# Patient Record
Sex: Female | Born: 1965 | Race: Black or African American | Hispanic: No | Marital: Single | Smoking: Former smoker
Health system: Southern US, Community
[De-identification: ages and names within clinical notes are randomized; demographics above are authoritative.]

## PROBLEM LIST (undated history)

## (undated) DIAGNOSIS — D352 Benign neoplasm of pituitary gland: Secondary | ICD-10-CM

## (undated) DIAGNOSIS — E785 Hyperlipidemia, unspecified: Secondary | ICD-10-CM

## (undated) DIAGNOSIS — R7303 Prediabetes: Secondary | ICD-10-CM

## (undated) DIAGNOSIS — O24419 Gestational diabetes mellitus in pregnancy, unspecified control: Secondary | ICD-10-CM

## (undated) DIAGNOSIS — E2740 Unspecified adrenocortical insufficiency: Secondary | ICD-10-CM

## (undated) HISTORY — DX: Prediabetes: R73.03

## (undated) HISTORY — PX: NO PAST SURGERIES: SHX2092

## (undated) HISTORY — PX: TRANSPHENOIDAL / TRANSNASAL HYPOPHYSECTOMY / RESECTION PITUITARY TUMOR: SUR1382

## (undated) HISTORY — DX: Hyperlipidemia, unspecified: E78.5

---

## 1999-09-21 ENCOUNTER — Encounter (INDEPENDENT_AMBULATORY_CARE_PROVIDER_SITE_OTHER): Payer: Self-pay | Admitting: Specialist

## 1999-09-21 ENCOUNTER — Other Ambulatory Visit: Admission: RE | Admit: 1999-09-21 | Discharge: 1999-09-21 | Payer: Self-pay | Admitting: Gastroenterology

## 2001-09-18 ENCOUNTER — Other Ambulatory Visit: Admission: RE | Admit: 2001-09-18 | Discharge: 2001-09-18 | Payer: Self-pay | Admitting: Obstetrics and Gynecology

## 2001-09-22 ENCOUNTER — Encounter: Payer: Self-pay | Admitting: Obstetrics and Gynecology

## 2001-09-22 ENCOUNTER — Encounter: Admission: RE | Admit: 2001-09-22 | Discharge: 2001-09-22 | Payer: Self-pay | Admitting: Obstetrics and Gynecology

## 2002-09-20 ENCOUNTER — Other Ambulatory Visit: Admission: RE | Admit: 2002-09-20 | Discharge: 2002-09-20 | Payer: Self-pay | Admitting: Internal Medicine

## 2003-03-19 ENCOUNTER — Emergency Department (HOSPITAL_COMMUNITY): Admission: EM | Admit: 2003-03-19 | Discharge: 2003-03-19 | Payer: Self-pay | Admitting: Emergency Medicine

## 2003-05-11 ENCOUNTER — Other Ambulatory Visit: Admission: RE | Admit: 2003-05-11 | Discharge: 2003-05-11 | Payer: Self-pay | Admitting: Obstetrics and Gynecology

## 2003-06-20 ENCOUNTER — Emergency Department (HOSPITAL_COMMUNITY): Admission: EM | Admit: 2003-06-20 | Discharge: 2003-06-20 | Payer: Self-pay | Admitting: Emergency Medicine

## 2004-06-28 ENCOUNTER — Other Ambulatory Visit: Admission: RE | Admit: 2004-06-28 | Discharge: 2004-06-28 | Payer: Self-pay | Admitting: Obstetrics and Gynecology

## 2005-06-05 ENCOUNTER — Other Ambulatory Visit: Admission: RE | Admit: 2005-06-05 | Discharge: 2005-06-05 | Payer: Self-pay | Admitting: Obstetrics and Gynecology

## 2008-03-21 ENCOUNTER — Other Ambulatory Visit: Admission: RE | Admit: 2008-03-21 | Discharge: 2008-03-21 | Payer: Self-pay | Admitting: Family Medicine

## 2012-08-14 ENCOUNTER — Emergency Department (HOSPITAL_BASED_OUTPATIENT_CLINIC_OR_DEPARTMENT_OTHER)
Admission: EM | Admit: 2012-08-14 | Discharge: 2012-08-14 | Disposition: A | Discharge status: Home / Self Care | Payer: Self-pay | Attending: Emergency Medicine | Admitting: Emergency Medicine

## 2012-08-14 ENCOUNTER — Encounter (HOSPITAL_BASED_OUTPATIENT_CLINIC_OR_DEPARTMENT_OTHER): Payer: Self-pay | Admitting: Family Medicine

## 2012-08-14 DIAGNOSIS — B379 Candidiasis, unspecified: Secondary | ICD-10-CM | POA: Insufficient documentation

## 2012-08-14 DIAGNOSIS — N390 Urinary tract infection, site not specified: Secondary | ICD-10-CM | POA: Insufficient documentation

## 2012-08-14 DIAGNOSIS — N760 Acute vaginitis: Secondary | ICD-10-CM | POA: Insufficient documentation

## 2012-08-14 DIAGNOSIS — B373 Candidiasis of vulva and vagina: Secondary | ICD-10-CM

## 2012-08-14 DIAGNOSIS — B9689 Other specified bacterial agents as the cause of diseases classified elsewhere: Secondary | ICD-10-CM

## 2012-08-14 DIAGNOSIS — R109 Unspecified abdominal pain: Secondary | ICD-10-CM | POA: Insufficient documentation

## 2012-08-14 DIAGNOSIS — R300 Dysuria: Secondary | ICD-10-CM | POA: Insufficient documentation

## 2012-08-14 DIAGNOSIS — R350 Frequency of micturition: Secondary | ICD-10-CM | POA: Insufficient documentation

## 2012-08-14 DIAGNOSIS — Z3202 Encounter for pregnancy test, result negative: Secondary | ICD-10-CM | POA: Insufficient documentation

## 2012-08-14 DIAGNOSIS — B3731 Acute candidiasis of vulva and vagina: Secondary | ICD-10-CM

## 2012-08-14 DIAGNOSIS — Z880 Allergy status to penicillin: Secondary | ICD-10-CM | POA: Insufficient documentation

## 2012-08-14 LAB — URINALYSIS, ROUTINE W REFLEX MICROSCOPIC
Bilirubin Urine: NEGATIVE
Glucose, UA: NEGATIVE mg/dL
Hgb urine dipstick: NEGATIVE
Ketones, ur: NEGATIVE mg/dL
Nitrite: NEGATIVE
Protein, ur: NEGATIVE mg/dL
Specific Gravity, Urine: 1.022 (ref 1.005–1.030)
Urobilinogen, UA: 0.2 mg/dL (ref 0.0–1.0)
pH: 7 (ref 5.0–8.0)

## 2012-08-14 LAB — URINE MICROSCOPIC-ADD ON

## 2012-08-14 LAB — WET PREP, GENITAL
Trich, Wet Prep: NONE SEEN
Yeast Wet Prep HPF POC: NONE SEEN

## 2012-08-14 LAB — PREGNANCY, URINE: Preg Test, Ur: NEGATIVE

## 2012-08-14 MED ORDER — CEPHALEXIN 500 MG PO CAPS: 500.0000 mg | ORAL_CAPSULE | Freq: Four times a day (QID) | ORAL | Status: DC

## 2012-08-14 MED ORDER — FLUCONAZOLE 50 MG PO TABS
150.0000 mg | ORAL_TABLET | Freq: Once | ORAL | Status: AC
  Administered 2012-08-14: 150 mg via ORAL
  Filled 2012-08-14: qty 1

## 2012-08-14 MED ORDER — METRONIDAZOLE 500 MG PO TABS: 500.0000 mg | ORAL_TABLET | Freq: Two times a day (BID) | ORAL | Status: DC

## 2012-08-14 NOTE — ED Provider Notes (Addendum)
History    CSN: [id number] Arrival date & time 08/14/12  58  First MD Initiated Contact with Patient 08/14/12 1104     Chief Complaint  Patient presents with  . Vaginal Discharge  . Dysuria   (Consider location/radiation/quality/duration/timing/severity/associated sxs/prior Treatment) Patient is a 47 y.o. female presenting with vaginal discharge and dysuria. The history is provided by the patient.  Vaginal Discharge Associated symptoms: abdominal pain and dysuria   Associated symptoms: no fever, no nausea and no vomiting   Dysuria Associated symptoms: abdominal pain and vaginal discharge   Associated symptoms: no fever, no nausea and no vomiting    Two day history of whitish vaginal discharge dysuria and urinary frequency. Associated with mild lower quadrant abdominal pain 2/10. No back pain no nausea vomiting or diarrhea no fever. Patient is not concerned about an STD. Pain is achy in nature. Not made worse or better by anything.    History reviewed. No pertinent past medical history. History reviewed. No pertinent past surgical history. No family history on file. History  Substance Use Topics  . Smoking status: Never Smoker   . Smokeless tobacco: Not on file  . Alcohol Use: Yes   OB History   Grav Para Term Preterm Abortions TAB SAB Ect Mult Living                 Review of Systems  Constitutional: Negative for fever.  HENT: Negative for sore throat.   Respiratory: Negative for shortness of breath.   Cardiovascular: Negative for chest pain.  Gastrointestinal: Positive for abdominal pain. Negative for nausea, vomiting and diarrhea.  Endocrine: Negative for polydipsia and polyuria.  Genitourinary: Positive for dysuria, frequency and vaginal discharge.  Musculoskeletal: Negative for myalgias and back pain.  Skin: Negative for rash.  Neurological: Negative for headaches.  Hematological: Does not bruise/bleed easily.  Psychiatric/Behavioral: Negative for confusion.     Allergies  Amoxicillin  Home Medications  No current outpatient prescriptions on file. BP 109/68  Pulse 74  Temp(Src) 98.3 F (36.8 C) (Oral)  Resp 20  SpO2 100%  LMP 07/02/2012 Physical Exam  Nursing note and vitals reviewed. Constitutional: She is oriented to person, place, and time. She appears well-developed and well-nourished. No distress.  HENT:  Head: Normocephalic and atraumatic.  Mouth/Throat: Oropharynx is clear and moist.  Eyes: Conjunctivae and EOM are normal. Pupils are equal, round, and reactive to light.  Cardiovascular: Normal rate, regular rhythm and normal heart sounds.   No murmur heard. Pulmonary/Chest: Effort normal and breath sounds normal.  Abdominal: Soft. Bowel sounds are normal. She exhibits no mass. There is no tenderness. There is no rebound.  Genitourinary: Vaginal discharge found.  External genitalia is normal. No lesions. Whitish thick vaginal discharge. Vaginal vault normal no cervical motion tenderness no uterine tenderness no adnexal tenderness.  Musculoskeletal: Normal range of motion. She exhibits no edema.  Neurological: She is alert and oriented to person, place, and time. No cranial nerve deficit. She exhibits normal muscle tone. Coordination normal.  Skin: Skin is warm. No rash noted.    ED Course  Procedures (including critical care time) Labs Reviewed  URINALYSIS, ROUTINE W REFLEX MICROSCOPIC - Abnormal; Notable for the following:    APPearance CLOUDY (*)    Leukocytes, UA SMALL (*)    All other components within normal limits  URINE MICROSCOPIC-ADD ON - Abnormal; Notable for the following:    Squamous Epithelial / LPF MANY (*)    Bacteria, UA MANY (*)    All  other components within normal limits  URINE CULTURE  GC/CHLAMYDIA PROBE AMP  WET PREP, GENITAL  PREGNANCY, URINE   Results for orders placed during the hospital encounter of 08/14/12  URINALYSIS, ROUTINE W REFLEX MICROSCOPIC      Result Value Range   Color, Urine  YELLOW  YELLOW   APPearance CLOUDY (*) CLEAR   Specific Gravity, Urine 1.022  1.005 - 1.030   pH 7.0  5.0 - 8.0   Glucose, UA NEGATIVE  NEGATIVE mg/dL   Hgb urine dipstick NEGATIVE  NEGATIVE   Bilirubin Urine NEGATIVE  NEGATIVE   Ketones, ur NEGATIVE  NEGATIVE mg/dL   Protein, ur NEGATIVE  NEGATIVE mg/dL   Urobilinogen, UA 0.2  0.0 - 1.0 mg/dL   Nitrite NEGATIVE  NEGATIVE   Leukocytes, UA SMALL (*) NEGATIVE  PREGNANCY, URINE      Result Value Range   Preg Test, Ur NEGATIVE  NEGATIVE  URINE MICROSCOPIC-ADD ON      Result Value Range   Squamous Epithelial / LPF MANY (*) RARE   WBC, UA 3-6  <3 WBC/hpf   RBC / HPF 0-2  <3 RBC/hpf   Bacteria, UA MANY (*) RARE    Results for orders placed during the hospital encounter of 08/14/12  WET PREP, GENITAL      Result Value Range   Yeast Wet Prep HPF POC NONE SEEN  NONE SEEN   Trich, Wet Prep NONE SEEN  NONE SEEN   Clue Cells Wet Prep HPF POC MANY (*) NONE SEEN   WBC, Wet Prep HPF POC MANY (*) NONE SEEN  URINALYSIS, ROUTINE W REFLEX MICROSCOPIC      Result Value Range   Color, Urine YELLOW  YELLOW   APPearance CLOUDY (*) CLEAR   Specific Gravity, Urine 1.022  1.005 - 1.030   pH 7.0  5.0 - 8.0   Glucose, UA NEGATIVE  NEGATIVE mg/dL   Hgb urine dipstick NEGATIVE  NEGATIVE   Bilirubin Urine NEGATIVE  NEGATIVE   Ketones, ur NEGATIVE  NEGATIVE mg/dL   Protein, ur NEGATIVE  NEGATIVE mg/dL   Urobilinogen, UA 0.2  0.0 - 1.0 mg/dL   Nitrite NEGATIVE  NEGATIVE   Leukocytes, UA SMALL (*) NEGATIVE  PREGNANCY, URINE      Result Value Range   Preg Test, Ur NEGATIVE  NEGATIVE  URINE MICROSCOPIC-ADD ON      Result Value Range   Squamous Epithelial / LPF MANY (*) RARE   WBC, UA 3-6  <3 WBC/hpf   RBC / HPF 0-2  <3 RBC/hpf   Bacteria, UA MANY (*) RARE       No results found.   1. UTI (lower urinary tract infection)     MDM  Patient with complaint of vaginal discharge and dysuria. The discharged present for 2 days. Patient has had also  some burning with urination and frequency. Mild discomfort in the lower cord is of the abdomen nothing severe. Patient's not concerned about an STD. No nausea no vomiting no diarrhea. Patient has penicillin listed as an allergy but it makes her nauseated this is most likely a side effect. We'll treat the UTI with Keflex. Wet prep from the vaginal exam is still pending. On vaginal exam no cervical motion tenderness no uterine tenderness no adnexal tenderness. There is a vaginal discharge that clinically is consistent with yeast. Patient will be treated with Diflucan for that.  Prep was seems to be consistent with bacterial vaginosis. Clinically still looks very much like these teeth  in the wet prep negative for that we'll treat with Diflucan here will give some day course of Flagyl and we'll treat the urinary tract infection with Keflex. No true allergy to penicillin sounds like.  Mervin Kung, MD 08/14/12 [place], MD 08/14/12 1247

## 2012-08-14 NOTE — ED Notes (Signed)
Pt c/o dysuria and vaginal discharge x 2 days. Pt sts she thinks she might have a "bacteria".

## 2012-08-15 LAB — GC/CHLAMYDIA PROBE AMP
CT Probe RNA: NEGATIVE
GC Probe RNA: NEGATIVE

## 2012-08-15 LAB — URINE CULTURE: Colony Count: 65000

## 2012-09-03 ENCOUNTER — Telehealth (HOSPITAL_COMMUNITY): Payer: Self-pay | Admitting: Emergency Medicine

## 2014-08-31 ENCOUNTER — Emergency Department (HOSPITAL_COMMUNITY)
Admission: EM | Admit: 2014-08-31 | Discharge: 2014-08-31 | Disposition: A | Discharge status: Home / Self Care | Payer: Self-pay | Attending: Emergency Medicine | Admitting: Emergency Medicine

## 2014-08-31 ENCOUNTER — Emergency Department (HOSPITAL_COMMUNITY): Payer: Self-pay

## 2014-08-31 ENCOUNTER — Encounter (HOSPITAL_COMMUNITY): Payer: Self-pay

## 2014-08-31 DIAGNOSIS — Z880 Allergy status to penicillin: Secondary | ICD-10-CM | POA: Insufficient documentation

## 2014-08-31 DIAGNOSIS — Z3202 Encounter for pregnancy test, result negative: Secondary | ICD-10-CM | POA: Insufficient documentation

## 2014-08-31 DIAGNOSIS — H538 Other visual disturbances: Secondary | ICD-10-CM | POA: Insufficient documentation

## 2014-08-31 DIAGNOSIS — R350 Frequency of micturition: Secondary | ICD-10-CM | POA: Insufficient documentation

## 2014-08-31 LAB — URINALYSIS, ROUTINE W REFLEX MICROSCOPIC
Bilirubin Urine: NEGATIVE
Glucose, UA: NEGATIVE mg/dL
Hgb urine dipstick: NEGATIVE
Ketones, ur: NEGATIVE mg/dL
Leukocytes, UA: NEGATIVE
Nitrite: NEGATIVE
Protein, ur: NEGATIVE mg/dL
Specific Gravity, Urine: 1.013 (ref 1.005–1.030)
Urobilinogen, UA: 1 mg/dL (ref 0.0–1.0)
pH: 8 (ref 5.0–8.0)

## 2014-08-31 LAB — CBG MONITORING, ED
Glucose-Capillary: 105 mg/dL — ABNORMAL HIGH (ref 65–99)
Glucose-Capillary: 64 mg/dL — ABNORMAL LOW (ref 65–99)
Glucose-Capillary: 116 mg/dL — ABNORMAL HIGH (ref 65–99)

## 2014-08-31 LAB — COMPREHENSIVE METABOLIC PANEL
ALT: 23 U/L (ref 14–54)
AST: 27 U/L (ref 15–41)
Albumin: 4.5 g/dL (ref 3.5–5.0)
Alkaline Phosphatase: 57 U/L (ref 38–126)
Anion gap: 8 (ref 5–15)
BUN: 12 mg/dL (ref 6–20)
CO2: 28 mmol/L (ref 22–32)
Calcium: 9.6 mg/dL (ref 8.9–10.3)
Chloride: 104 mmol/L (ref 101–111)
Creatinine, Ser: 0.69 mg/dL (ref 0.44–1.00)
GFR calc Af Amer: >60 mL/min (ref >60)
GFR calc non Af Amer: >60 mL/min (ref >60)
Glucose, Bld: 77 mg/dL (ref 65–99)
Potassium: 3.9 mmol/L (ref 3.5–5.1)
Sodium: 140 mmol/L (ref 135–145)
Total Bilirubin: 0.4 mg/dL (ref 0.3–1.2)
Total Protein: 8.3 g/dL — ABNORMAL HIGH (ref 6.5–8.1)

## 2014-08-31 LAB — CBC WITH DIFFERENTIAL/PLATELET
Basophils Absolute: 0 10*3/uL (ref 0.0–0.1)
Basophils Relative: 0 % (ref 0–1)
Eosinophils Absolute: 0 10*3/uL (ref 0.0–0.7)
Eosinophils Relative: 0 % (ref 0–5)
HCT: 42.7 % (ref 36.0–46.0)
Hemoglobin: 13.8 g/dL (ref 12.0–15.0)
Lymphocytes Relative: 38 % (ref 12–46)
Lymphs Abs: 2.1 10*3/uL (ref 0.7–4.0)
MCH: 29.4 pg (ref 26.0–34.0)
MCHC: 32.3 g/dL (ref 30.0–36.0)
MCV: 90.9 fL (ref 78.0–100.0)
Monocytes Absolute: 0.4 10*3/uL (ref 0.1–1.0)
Monocytes Relative: 8 % (ref 3–12)
Neutro Abs: 2.9 10*3/uL (ref 1.7–7.7)
Neutrophils Relative %: 54 % (ref 43–77)
Platelets: 221 10*3/uL (ref 150–400)
RBC: 4.7 MIL/uL (ref 3.87–5.11)
RDW: 13.6 % (ref 11.5–15.5)
WBC: 5.4 10*3/uL (ref 4.0–10.5)

## 2014-08-31 LAB — POC URINE PREG, ED: Preg Test, Ur: NEGATIVE

## 2014-08-31 MED ORDER — FREESTYLE SYSTEM KIT: 1.0000 | PACK | Status: DC | PRN

## 2014-08-31 MED ORDER — GLUCOSE BLOOD VI STRP: ORAL_STRIP | Status: DC

## 2014-08-31 NOTE — ED Notes (Signed)
Pt went to MRI.

## 2014-08-31 NOTE — ED Notes (Signed)
Per MRI will be at bedside to transport pt in approx 45 minutes.

## 2014-08-31 NOTE — ED Notes (Signed)
Patient given: orange juice, graham crackers and peanut butter due to low sugar of 64.

## 2014-08-31 NOTE — ED Notes (Signed)
Flushed patient's IV.  In additional, asked patient to remove jewelry for MRI and she said she wasn't claustrophobic.

## 2014-08-31 NOTE — Discharge Instructions (Signed)
Your MRI shows that your pituitary infundibulum could be thickened. This could be normal vs a process called lymphocytic hypophysitis. It is very important that you call and make an appointment with the endocrinologist above. You should also get a primary care doctor for regular health maintenance.     Blurred Vision You have been seen today complaining of blurred vision. This means you have a loss of ability to see small details.  CAUSES  Blurred vision can be a symptom of underlying eye problems, such as:  Aging of the eye (presbyopia).  Glaucoma.  Cataracts.  Eye infection.  Eye-related migraine.  Diabetes mellitus.  Fatigue.  Migraine headaches.  High blood pressure.  Breakdown of the back of the eye (macular degeneration).  Problems caused by some medications. The most common cause of blurred vision is the need for eyeglasses or a new prescription. Today in the emergency department, no cause for your blurred vision can be found. SYMPTOMS  Blurred vision is the loss of visual sharpness and detail (acuity). DIAGNOSIS  Should blurred vision continue, you should see your caregiver. If your caregiver is your primary care physician, he or she may choose to refer you to another specialist.  TREATMENT  Do not ignore your blurred vision. Make sure to have it checked out to see if further treatment or referral is necessary. SEEK MEDICAL CARE IF:  You are unable to get into a specialist so we can help you with a referral. SEEK IMMEDIATE MEDICAL CARE IF: You have severe eye pain, severe headache, or sudden loss of vision. MAKE SURE YOU:   Understand these instructions.  Will watch your condition.  Will get help right away if you are not doing well or get worse. Document Released: 02/07/2003 Document Revised: 04/29/2011 Document Reviewed: 09/09/2007 [facility] Patient Information 2015 [place], [place]. This information is not intended to replace advice given to you by your  health care provider. Make sure you discuss any questions you have with your health care provider.   Hypoglycemia Hypoglycemia occurs when the glucose in your blood is too low. Glucose is a type of sugar that is your body's main energy source. Hormones, such as insulin and glucagon, control the level of glucose in the blood. Insulin lowers blood glucose and glucagon increases blood glucose. Having too much insulin in your blood stream, or not eating enough food containing sugar, can result in hypoglycemia. Hypoglycemia can happen to people with or without diabetes. It can develop quickly and can be a medical emergency.  CAUSES   Missing or delaying meals.  Not eating enough carbohydrates at meals.  Taking too much diabetes medicine.  Not timing your oral diabetes medicine or insulin doses with meals, snacks, and exercise.  Nausea and vomiting.  Certain medicines.  Severe illnesses, such as hepatitis, kidney disorders, and certain eating disorders.  Increased activity or exercise without eating something extra or adjusting medicines.  Drinking too much alcohol.  A nerve disorder that affects body functions like your heart rate, blood pressure, and digestion (autonomic neuropathy).  A condition where the stomach muscles do not function properly (gastroparesis). Therefore, medicines and food may not absorb properly.  Rarely, a tumor of the pancreas can produce too much insulin. SYMPTOMS   Hunger.  Sweating (diaphoresis).  Change in body temperature.  Shakiness.  Headache.  Anxiety.  Lightheadedness.  Irritability.  Difficulty concentrating.  Dry mouth.  Tingling or numbness in the hands or feet.  Restless sleep or sleep disturbances.  Altered speech and coordination.  Change in mental status.  Seizures or prolonged convulsions.  Combativeness.  Drowsiness (lethargic).  Weakness.  Increased heart rate or palpitations.  Confusion.  Pale, gray skin  color.  Blurred or double vision.  Fainting. DIAGNOSIS  A physical exam and medical history will be performed. Your caregiver may make a diagnosis based on your symptoms. Blood tests and other lab tests may be performed to confirm a diagnosis. Once the diagnosis is made, your caregiver will see if your signs and symptoms go away once your blood glucose is raised.  TREATMENT  Usually, you can easily treat your hypoglycemia when you notice symptoms.  Check your blood glucose. If it is less than 70 mg/dl, take one of the following:   3-4 glucose tablets.    cup juice.    cup regular soda.   1 cup skim milk.   -1 tube of glucose gel.   5-6 hard candies.   Avoid high-fat drinks or food that may delay a rise in blood glucose levels.  Do not take more than the recommended amount of sugary foods, drinks, gel, or tablets. Doing so will cause your blood glucose to go too high.   Wait 10-15 minutes and recheck your blood glucose. If it is still less than 70 mg/dl or below your target range, repeat treatment.   Eat a snack if it is more than 1 hour until your next meal.  There may be a time when your blood glucose may go so low that you are unable to treat yourself at home when you start to notice symptoms. You may need someone to help you. You may even faint or be unable to swallow. If you cannot treat yourself, someone will need to bring you to the hospital.  [place]  If you have diabetes, follow your diabetes management plan by:  Taking your medicines as directed.  Following your exercise plan.  Following your meal plan. Do not skip meals. Eat on time.  Testing your blood glucose regularly. Check your blood glucose before and after exercise. If you exercise longer or different than usual, be sure to check blood glucose more frequently.  Wearing your medical alert jewelry that says you have diabetes.  Identify the cause of your hypoglycemia. Then,  develop ways to prevent the recurrence of hypoglycemia.  Do not take a hot bath or shower right after an insulin shot.  Always carry treatment with you. Glucose tablets are the easiest to carry.  If you are going to drink alcohol, drink it only with meals.  Tell friends or family members ways to keep you safe during a seizure. This may include removing hard or sharp objects from the area or turning you on your side.  Maintain a healthy weight. SEEK MEDICAL CARE IF:   You are having problems keeping your blood glucose in your target range.  You are having frequent episodes of hypoglycemia.  You feel you might be having side effects from your medicines.  You are not sure why your blood glucose is dropping so low.  You notice a change in vision or a new problem with your vision. SEEK IMMEDIATE MEDICAL CARE IF:   Confusion develops.  A change in mental status occurs.  The inability to swallow develops.  Fainting occurs. Document Released: 02/04/2005 Document Revised: 02/09/2013 Document Reviewed: 06/03/2011 [facility] Patient Information 2015 [place], [place]. This information is not intended to replace advice given to you by your health care provider. Make sure you discuss any questions  you have with your health care provider.

## 2014-08-31 NOTE — ED Notes (Addendum)
Pt with blurred vision x 2 weeks.  Pt states she is a diabetic.  Has had on/off previously.  Pt does not take medications.  Pt has also increased urination with slight lower abdominal pain.  Increased stress.

## 2014-08-31 NOTE — ED Provider Notes (Signed)
CSN: [id number]     Arrival date & time 08/31/14  9357 History   First MD Initiated Contact with Patient 08/31/14 1108     Chief Complaint  Patient presents with  . Blurred Vision     (Consider location/radiation/quality/duration/timing/severity/associated sxs/prior Treatment) HPI  49 year old female presents with intermittent blurred vision for the past 2 weeks. She has also been having increased urination without dysuria during this time. Patient states that the blurry vision happens 3 or 4 times per week. Sometimes happens early in the morning, sometimes in the afternoon. Sometimes she has associated sweating and usually if she eats it ends up being better. This morning she had only had coffee when she started having the blurry vision. She ate some food but did not immediately feel better she came in the hospital. Currently she has no blurry vision. Whenever she does have blurry vision and feels like everything is a little blurred but not double. It is both eyes at the same time. Had a headache last night but usually has not been having a headache and does not currently have a headache. Occasionally has left great toe numbness but no other numbness and no weakness. Patient states she was told she was a diabetic couple years ago in [place] but has never taken medicines for it. She has been trying diet and exercise. She has not been checking her glucose when these symptoms occur. She has not felt like she will pass out. Over the past 1 week or so she has been trying [place] for her diabetes.   History reviewed. No pertinent past medical history. History reviewed. No pertinent past surgical history. History reviewed. No pertinent family history. History  Substance Use Topics  . Smoking status: Never Smoker   . Smokeless tobacco: Not on file  . Alcohol Use: Yes   OB History    No data available     Review of Systems  Constitutional: Negative for fever.  Eyes: Positive for visual  disturbance. Negative for pain.  Gastrointestinal: Negative for vomiting.  Genitourinary: Positive for frequency. Negative for dysuria.  Neurological: Negative for dizziness, weakness, light-headedness, numbness and headaches.  All other systems reviewed and are negative.     Allergies  Amoxicillin  Home Medications   Prior to Admission medications   Medication Sig Start Date End Date Taking? Authorizing Provider  OVER THE COUNTER MEDICATION Take 1 tablet by mouth daily.   Yes Historical Provider, MD   BP 132/69 mmHg  Pulse 74  Temp(Src) 97.7 F (36.5 C) (Oral)  Resp 16  SpO2 100% Physical Exam  Constitutional: She is oriented to person, place, and time. She appears well-developed and well-nourished.  HENT:  Head: Normocephalic and atraumatic.  Right Ear: External ear normal.  Left Ear: External ear normal.  Nose: Nose normal.  Mouth/Throat: Oropharynx is clear and moist.  Eyes: Conjunctivae and EOM are normal. Pupils are equal, round, and reactive to light. Right eye exhibits no discharge. Left eye exhibits no discharge.  Neck: Neck supple.  Cardiovascular: Normal rate, regular rhythm and normal heart sounds.   Pulmonary/Chest: Effort normal and breath sounds normal.  Abdominal: Soft. There is no tenderness.  Neurological: She is alert and oriented to person, place, and time.  CN 2-12 grossly intact. 5/5 strength in all 4 extremities. Grossly normal sensation  Skin: Skin is warm and dry.  Nursing note and vitals reviewed.   ED Course  Procedures (including critical care time) Labs Review Labs Reviewed  COMPREHENSIVE METABOLIC PANEL -  Abnormal; Notable for the following:    Total Protein 8.3 (*)    All other components within normal limits  CBG MONITORING, ED - Abnormal; Notable for the following:    Glucose-Capillary 105 (*)    All other components within normal limits  CBG MONITORING, ED - Abnormal; Notable for the following:    Glucose-Capillary 64 (*)    All  other components within normal limits  CBG MONITORING, ED - Abnormal; Notable for the following:    Glucose-Capillary 116 (*)    All other components within normal limits  URINALYSIS, ROUTINE W REFLEX MICROSCOPIC (NOT AT [facility])  CBC WITH DIFFERENTIAL/PLATELET  POC URINE PREG, ED    Imaging Review Mr Brain Wo Contrast  08/31/2014   CLINICAL DATA:  49 year old female with intermittent blurred vision for 2 weeks. Numbness. Initial encounter.  EXAM: MRI HEAD WITHOUT CONTRAST  TECHNIQUE: Multiplanar, multiecho pulse sequences of the brain and surrounding structures were obtained without intravenous contrast.  COMPARISON:  None.  FINDINGS: Normal cerebral volume. No restricted diffusion to suggest acute infarction. No midline shift, mass effect, evidence of mass lesion, ventriculomegaly, extra-axial collection or acute intracranial hemorrhage. Cervicomedullary junction within normal limits. Major intracranial vascular flow voids are within normal limits. Pearline Cables and white matter signal is within normal limits for age throughout the brain.  Pituitary gland size and configuration appears within normal limits, however, the pituitary infundibulum appears abnormally thickened (up to 6 mm diameter. See series 6, image 11 and series 10, image 17). No contrast administered. No definite suprasellar region mass effect. Optic chiasm appears to remain normal. No definite optic nerve abnormality, and other orbits soft tissues appear within normal limits.  Visible internal auditory structures appear normal. Visualized paranasal sinuses and mastoids are clear. Visualized scalp soft tissues are within normal limits. Normal bone marrow signal. Negative visualized cervical spine.  IMPRESSION: 1. 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 Recommend pituitary laboratory evaluation and followup Pituitary Protocol Brain MRI  without and with contrast to further characterize. 2. Otherwise normal for age noncontrast MRI appearance of the brain.   Electronically Signed   By: Genevie Ann M.D.   On: 08/31/2014 14:05     EKG Interpretation None      MDM   Final diagnoses:  Blurred vision    Patient's MRI has a possibly abnormal pituitary infundibulum but no compression that could cause blurry vision. I discussed the results and reviewed MRI with neuro on call, Dr. Janann Colonel, who does not believe this is causing her blurry vision but recommends outpatient endocrinology follow up. Her glucose went down to 64, which came up with oral intake. She later told me her symptoms recurred when her glucose was this low. Her labs currently are unremarkable, and technically she is not hypoglycemia. I will recommend establishing PCP f/u, following up with endocrinology, and getting a glucometer to take glucose readings when she feels these symptoms. Otherwise her neuro exam is normal, is stable for discharge.     Sherwood Gambler, MD 08/31/14 [phone]

## 2014-09-27 ENCOUNTER — Other Ambulatory Visit: Payer: Self-pay | Admitting: Internal Medicine

## 2014-09-28 ENCOUNTER — Other Ambulatory Visit: Payer: Self-pay | Admitting: Internal Medicine

## 2014-09-28 DIAGNOSIS — E237 Disorder of pituitary gland, unspecified: Secondary | ICD-10-CM

## 2014-10-01 ENCOUNTER — Ambulatory Visit
Admission: RE | Admit: 2014-10-01 | Discharge: 2014-10-01 | Disposition: A | Discharge status: Home / Self Care | Payer: 59 | Source: Ambulatory Visit | Attending: Internal Medicine | Admitting: Internal Medicine

## 2014-10-01 DIAGNOSIS — E237 Disorder of pituitary gland, unspecified: Secondary | ICD-10-CM

## 2014-10-01 MED ORDER — GADOBENATE DIMEGLUMINE 529 MG/ML IV SOLN
7.0000 mL | Freq: Once | INTRAVENOUS | Status: AC | PRN
  Administered 2014-10-01: 7 mL via INTRAVENOUS

## 2015-05-06 ENCOUNTER — Encounter (HOSPITAL_COMMUNITY): Payer: Self-pay | Admitting: *Deleted

## 2015-05-06 ENCOUNTER — Emergency Department (HOSPITAL_COMMUNITY)
Admission: EM | Admit: 2015-05-06 | Discharge: 2015-05-07 | Disposition: A | Discharge status: Home / Self Care | Payer: Self-pay | Attending: Emergency Medicine | Admitting: Emergency Medicine

## 2015-05-06 DIAGNOSIS — Z79899 Other long term (current) drug therapy: Secondary | ICD-10-CM | POA: Insufficient documentation

## 2015-05-06 DIAGNOSIS — R7309 Other abnormal glucose: Secondary | ICD-10-CM | POA: Insufficient documentation

## 2015-05-06 DIAGNOSIS — Z880 Allergy status to penicillin: Secondary | ICD-10-CM | POA: Insufficient documentation

## 2015-05-06 DIAGNOSIS — R7303 Prediabetes: Secondary | ICD-10-CM

## 2015-05-06 DIAGNOSIS — D849 Immunodeficiency, unspecified: Secondary | ICD-10-CM | POA: Insufficient documentation

## 2015-05-06 DIAGNOSIS — R631 Polydipsia: Secondary | ICD-10-CM | POA: Insufficient documentation

## 2015-05-06 DIAGNOSIS — H538 Other visual disturbances: Secondary | ICD-10-CM | POA: Insufficient documentation

## 2015-05-06 LAB — CBG MONITORING, ED: Glucose-Capillary: 80 mg/dL (ref 65–99)

## 2015-05-06 NOTE — ED Notes (Signed)
Pt states that her blood sugar had been going up and down for the last couple of weeks; pt states that it will be around 70 in the morning and then go up to around 150 after eating; pt states that she feels symptomatic around 150; pt states that she has had "blurry vision", increased thirst and increased urination at times; pt denies blurry vision at present; pt states that she has been diagnosed as pre-diabetic and had been controlling her blood sugar with diet and exercise; pt states that it is concerning her and she wants to get it evaluated

## 2015-05-07 NOTE — ED Provider Notes (Signed)
CSN: [id number]     Arrival date & time 05/06/15  2153 History   First MD Initiated Contact with Patient 05/07/15 0044     Chief Complaint  Patient presents with  . Diabetes     (Consider location/radiation/quality/duration/timing/severity/associated sxs/prior Treatment) HPI Comments: Rachel Bowen is a 50 y.o. female with a PMHx of pre-diabetes, who presents to the ED with complaints of blood sugars running from 70-154 and concerns regarding her prediabetes. She states that when she eats, her blood sugars will go into the 150s, and she gets slightly blurry vision with increased thirst when that occurs. This resolves after her blood sugar comes down into the normal range. She states that she is controlling her prediabetes with diet, but admits that she has not been exercising recently and started a new job which is caused more stress. She was concerned and wanted to get evaluated because earlier today her blood sugar was 154, although upon arrival it is 80. She currently has no complaints, denies any ongoing vision changes, polydipsia, recent fevers or chills, chest pain, shortness breath, abdominal pain, nausea vomiting, diarrhea, cons patient, dysuria, hematuria, numbness, tingling, weakness, or headaches. She has never taken anything for diabetes. PCP at [facility]. She currently feels full resolution of all her symptoms from earlier.   Patient is a 50 y.o. female presenting with diabetes problem. The history is provided by the patient and medical records. No language interpreter was used.  Diabetes This is a recurrent problem. The current episode started more than 1 year ago. The problem occurs intermittently. The problem has been waxing and waning. Pertinent negatives include no abdominal pain, arthralgias, chest pain, chills, fever, headaches, myalgias, nausea, numbness, urinary symptoms, vomiting or weakness. The symptoms are aggravated by eating. She has tried nothing for the  symptoms. The treatment provided no relief.    History reviewed. No pertinent past medical history. History reviewed. No pertinent past surgical history. No family history on file. Social History  Substance Use Topics  . Smoking status: Never Smoker   . Smokeless tobacco: None  . Alcohol Use: Yes   OB History    No data available     Review of Systems  Constitutional: Negative for fever and chills.  Eyes: Positive for visual disturbance (blurry vision with CBG 150s, none at this time).  Respiratory: Negative for shortness of breath.   Cardiovascular: Negative for chest pain.  Gastrointestinal: Negative for nausea, vomiting, abdominal pain and diarrhea.  Endocrine: Positive for polydipsia.  Genitourinary: Negative for dysuria and hematuria.  Musculoskeletal: Negative for myalgias and arthralgias.  Skin: Negative for color change.  Allergic/Immunologic: Positive for immunocompromised state (pre-diabetic).  Neurological: Negative for weakness, numbness and headaches.  Psychiatric/Behavioral: Negative for confusion.   10 Systems reviewed and are negative for acute change except as noted in the HPI.    Allergies  Amoxicillin  Home Medications   Prior to Admission medications   Medication Sig Start Date End Date Taking? Authorizing Provider  glucose blood (COOL BLOOD GLUCOSE TEST STRIPS) test strip Use as instructed 08/31/14   Sherwood Gambler, MD  glucose monitoring kit (FREESTYLE) monitoring kit 1 each by Does not apply route as needed for other. 08/31/14   Sherwood Gambler, MD  OVER THE COUNTER MEDICATION Take 1 tablet by mouth daily.    Historical Provider, MD   BP 111/84 mmHg  Pulse 67  Temp(Src) 97.7 F (36.5 C) (Oral)  Resp 16  SpO2 99%  LMP 07/02/2012 Physical Exam  Constitutional: She  is oriented to person, place, and time. Vital signs are normal. She appears well-developed and well-nourished.  Non-toxic appearance. No distress.  Afebrile, nontoxic, NAD  HENT:   Head: Normocephalic and atraumatic.  Mouth/Throat: Oropharynx is clear and moist and mucous membranes are normal.  Eyes: Conjunctivae and EOM are normal. Pupils are equal, round, and reactive to light. Right eye exhibits no discharge. Left eye exhibits no discharge.  PERRL, EOMI, no nystagmus, no visual field deficits   Neck: Normal range of motion. Neck supple.  Cardiovascular: Normal rate, regular rhythm, normal heart sounds and intact distal pulses.  Exam reveals no gallop and no friction rub.   No murmur heard. Pulmonary/Chest: Effort normal and breath sounds normal. No respiratory distress. She has no decreased breath sounds. She has no wheezes. She has no rhonchi. She has no rales.  Abdominal: Soft. Normal appearance and bowel sounds are normal. She exhibits no distension. There is no tenderness. There is no rigidity, no rebound, no guarding, no CVA tenderness, no tenderness at McBurney's point and negative Murphy's sign.  Musculoskeletal: Normal range of motion.  MAE x4 Strength and sensation grossly intact Distal pulses intact Gait steady  Neurological: She is alert and oriented to person, place, and time. She has normal strength. No cranial nerve deficit or sensory deficit. Coordination and gait normal. GCS eye subscore is 4. GCS verbal subscore is 5. GCS motor subscore is 6.  CN 2-12 grossly intact A&O x4 GCS 15 Sensation and strength intact Gait nonataxic Coordination with finger-to-nose WNL  Skin: Skin is warm, dry and intact. No rash noted.  Psychiatric: She has a normal mood and affect.  Nursing note and vitals reviewed.   ED Course  Procedures (including critical care time) Labs Review Labs Reviewed  CBG MONITORING, ED    Imaging Review No results found. I have personally reviewed and evaluated these images and lab results as part of my medical decision-making.   EKG Interpretation None      MDM   Final diagnoses:  Pre-diabetes  Blurry vision    50  y.o. female here with concern about her blood sugars fluctuating, range 70-150, when she gets to the 150 range she gets symptomatic with blurry vision, but this resolved. CBG here 80, no acute complaints. Admits more stress recently and less exercising, feels this may be contributing to her CBGs running higher. Discussed staying hydrated with water, exercising 3x/wk, and f/up with PCP. Doubt need for further labs/imaging. Neurologically intact, no focal findings on exam. Pt stable at this time. I explained the diagnosis and have given explicit precautions to return to the ER including for any other new or worsening symptoms. The patient understands and accepts the medical plan as it's been dictated and I have answered their questions. Discharge instructions concerning home care and prescriptions have been given. The patient is STABLE and is discharged to home in good condition.   BP 111/84 mmHg  Pulse 67  Temp(Src) 97.7 F (36.5 C) (Oral)  Resp 16  SpO2 99%  LMP 07/02/2012  No orders of the defined types were placed in this encounter.     Loistine Eberlin Camprubi-Soms, PA-C 05/07/15 0100  Veryl Speak, MD 05/08/15 [phone]

## 2015-05-07 NOTE — Discharge Instructions (Signed)
Continue the diet changes as you have been doing. Start exercising at least 30 minutes 3 times per week. When you feel symptoms of high blood sugar, drink plenty of water. Follow up with your regular doctor in 1 week for ongoing management of your symptoms. Return to the ER for changes or worsening symptoms.

## 2015-08-23 ENCOUNTER — Other Ambulatory Visit: Payer: Self-pay | Admitting: Physician Assistant

## 2015-08-23 ENCOUNTER — Other Ambulatory Visit (HOSPITAL_COMMUNITY)
Admission: RE | Admit: 2015-08-23 | Discharge: 2015-08-23 | Disposition: A | Discharge status: Home / Self Care | Payer: 59 | Source: Ambulatory Visit | Attending: Physician Assistant | Admitting: Physician Assistant

## 2015-08-23 DIAGNOSIS — Z01419 Encounter for gynecological examination (general) (routine) without abnormal findings: Secondary | ICD-10-CM | POA: Insufficient documentation

## 2015-08-23 DIAGNOSIS — Z1151 Encounter for screening for human papillomavirus (HPV): Secondary | ICD-10-CM | POA: Diagnosis not present

## 2015-08-24 LAB — CYTOLOGY - PAP

## 2017-03-08 ENCOUNTER — Emergency Department (HOSPITAL_COMMUNITY)
Admission: EM | Admit: 2017-03-08 | Discharge: 2017-03-08 | Disposition: A | Discharge status: Home / Self Care | Payer: 59 | Attending: Emergency Medicine | Admitting: Emergency Medicine

## 2017-03-08 ENCOUNTER — Other Ambulatory Visit: Payer: Self-pay

## 2017-03-08 ENCOUNTER — Encounter (HOSPITAL_COMMUNITY): Payer: Self-pay

## 2017-03-08 DIAGNOSIS — R42 Dizziness and giddiness: Secondary | ICD-10-CM | POA: Insufficient documentation

## 2017-03-08 DIAGNOSIS — E162 Hypoglycemia, unspecified: Secondary | ICD-10-CM

## 2017-03-08 DIAGNOSIS — R7303 Prediabetes: Secondary | ICD-10-CM

## 2017-03-08 DIAGNOSIS — Z87891 Personal history of nicotine dependence: Secondary | ICD-10-CM | POA: Insufficient documentation

## 2017-03-08 HISTORY — DX: Gestational diabetes mellitus in pregnancy, unspecified control: O24.419

## 2017-03-08 LAB — CBC
HCT: 44.6 % (ref 36.0–46.0)
Hemoglobin: 15 g/dL (ref 12.0–15.0)
MCH: 30.4 pg (ref 26.0–34.0)
MCHC: 33.6 g/dL (ref 30.0–36.0)
MCV: 90.5 fL (ref 78.0–100.0)
Platelets: 199 10*3/uL (ref 150–400)
RBC: 4.93 MIL/uL (ref 3.87–5.11)
RDW: 13.8 % (ref 11.5–15.5)
WBC: 5.1 10*3/uL (ref 4.0–10.5)

## 2017-03-08 LAB — BASIC METABOLIC PANEL
Anion gap: 7 (ref 5–15)
BUN: 13 mg/dL (ref 6–20)
CO2: 27 mmol/L (ref 22–32)
Calcium: 9.1 mg/dL (ref 8.9–10.3)
Chloride: 106 mmol/L (ref 101–111)
Creatinine, Ser: 0.64 mg/dL (ref 0.44–1.00)
GFR calc Af Amer: >60 mL/min (ref >60)
GFR calc non Af Amer: >60 mL/min (ref >60)
Glucose, Bld: 99 mg/dL (ref 65–99)
Potassium: 4 mmol/L (ref 3.5–5.1)
Sodium: 140 mmol/L (ref 135–145)

## 2017-03-08 LAB — I-STAT BETA HCG BLOOD, ED (MC, WL, AP ONLY): I-stat hCG, quantitative: <5 m[IU]/mL (ref <5)

## 2017-03-08 LAB — CBG MONITORING, ED
Glucose-Capillary: 84 mg/dL (ref 65–99)
Glucose-Capillary: 93 mg/dL (ref 65–99)

## 2017-03-08 NOTE — ED Triage Notes (Signed)
Pt reports fluctuating blood sugar x "a couple weeks."  Sts dizziness this morning and found her CBG to be 60.  Hx of prediabetes.  Sts she has been under a lot of stress.

## 2017-03-08 NOTE — ED Provider Notes (Signed)
[facility] DEPT Provider Note   CSN: [id number] Arrival date & time: 03/08/17  1223     History   Chief Complaint Chief Complaint  Patient presents with  . Medical Advice  . Blood Sugar Issue    HPI Rachel Bowen is a 52 y.o. female presenting for concerns about her blood sugar and evaluation of her lab work.  Patient states she was diagnosed with prediabetes when she was pregnant several years ago.  Since then, she has had fluctuation of her blood sugars.  This is been worse over the past several weeks.  She lost her job, no longer has insurance so she cannot follow-up with primary care.  She reports this morning, her blood sugar was low at 60 and she felt dizzy.  This improved after she and her blood sugar got better.  She came to the emergency room for further evaluation of her blood sugar and lab work.  Currently she is without any complaint.  She denies fevers, chills, nasal congestion, cough, sore throat, chest pain, difficulty breathing, nausea, vomiting, abdominal pain, urinary symptoms, abnormal bowel movements.  She has never taken any medicine for diabetes.  She states she has been trying to regulate her diabetes with diet.  For the past several weeks, has been fluctuating more.  She reports her lows are in the 60s and her highs are in the 160s.  She has no other medical problems, does not take medications daily.  HPI  Past Medical History:  Diagnosis Date  . Gestational diabetes     There are no active problems to display for this patient.   History reviewed. No pertinent surgical history.  OB History    No data available       Home Medications    Prior to Admission medications   Medication Sig Start Date End Date Taking? Authorizing Provider  OVER THE COUNTER MEDICATION Take 1 tablet by mouth daily.    [provider]    Family History History reviewed. No pertinent family history.  Social History Social History     Tobacco Use  . Smoking status: Former Research scientist (life sciences)  . Smokeless tobacco: Never Used  Substance Use Topics  . Alcohol use: No    Frequency: Never  . Drug use: No     Allergies   Amoxicillin   Review of Systems Review of Systems  Neurological: Positive for dizziness (resolved).  All other systems reviewed and are negative.    Physical Exam Updated Vital Signs BP 124/70 (BP Location: Right Arm)   Pulse 71   Temp 98.1 F (36.7 C) (Oral)   Resp 14   Ht 5' 2"  (1.575 m)   Wt 71.7 kg (158 lb)   LMP 07/02/2012   SpO2 99%   BMI 28.90 kg/m   Physical Exam  Constitutional: She is oriented to person, place, and time. She appears well-developed and well-nourished. No distress.  HENT:  Head: Normocephalic and atraumatic.  Eyes: Conjunctivae and EOM are normal. Pupils are equal, round, and reactive to light.  Neck: Normal range of motion.  Cardiovascular: Normal rate, regular rhythm and intact distal pulses.  Pulmonary/Chest: Effort normal and breath sounds normal. No respiratory distress. She has no wheezes.  Abdominal: Soft. She exhibits no distension and no mass. There is no tenderness. There is no guarding.  Musculoskeletal: Normal range of motion.  Neurological: She is alert and oriented to person, place, and time.  Skin: Skin is warm and dry.  Psychiatric: She has  a normal mood and affect.  Nursing note and vitals reviewed.    ED Treatments / Results  Labs (all labs ordered are listed, but only abnormal results are displayed) Labs Reviewed  BASIC METABOLIC PANEL  CBC  CBG MONITORING, ED  CBG MONITORING, ED  I-STAT BETA HCG BLOOD, ED ([facility], [facility], [facility] ONLY)    EKG  EKG Interpretation None       Radiology No results found.  Procedures Procedures (including critical care time)  Medications Ordered in ED Medications - No data to display   Initial Impression / Assessment and Plan / ED Course  I have reviewed the triage vital signs and the nursing  notes.  Pertinent labs & imaging results that were available during my care of the patient were reviewed by me and considered in my medical decision making (see chart for details).     Patient presenting for evaluation of her blood sugar and for lab work.  Physical exam reassuring, there is no abnormal findings.  Patient without any symptoms at this time.  Labs reassuring, glucose is stable.  Creatinine stable.  Discussed with patient that at this time, her blood sugar appears normal.  She will need to follow-up with primary care for further evaluation of her A1c.  Patient provided information for [facility] for follow-up.  Discussed diet and gave information about diet in diabetes.  At this time, patient appears safe for discharge.  Return precautions given.  Patient states she understands and agrees to plan.   Final Clinical Impressions(s) / ED Diagnoses   Final diagnoses:  Prediabetes  Low blood sugar    ED Discharge Orders    None       Franchot Heidelberg, PA-C 03/08/17 Berenice Primas, MD 03/08/17 [phone]

## 2017-03-08 NOTE — Discharge Instructions (Signed)
Continue to monitor your diet to help control your blood sugar - you are doing a great job! It is very important that you follow-up with [facility] for further evaluation and management of your blood sugars. Return to the emergency room if you develop chest pain, difficulty breathing, fevers, vomiting, or any new or concerning symptoms.

## 2017-03-17 ENCOUNTER — Inpatient Hospital Stay: Payer: Medicaid Other

## 2017-03-17 ENCOUNTER — Ambulatory Visit: Payer: Self-pay | Attending: Internal Medicine | Admitting: Physician Assistant

## 2017-03-17 VITALS — BP 115/76 | HR 75 | Temp 97.9°F | Ht 62.0 in | Wt 153.8 lb

## 2017-03-17 DIAGNOSIS — Z880 Allergy status to penicillin: Secondary | ICD-10-CM | POA: Insufficient documentation

## 2017-03-17 DIAGNOSIS — R7303 Prediabetes: Secondary | ICD-10-CM | POA: Insufficient documentation

## 2017-03-17 DIAGNOSIS — Z8632 Personal history of gestational diabetes: Secondary | ICD-10-CM | POA: Insufficient documentation

## 2017-03-17 DIAGNOSIS — E236 Other disorders of pituitary gland: Secondary | ICD-10-CM

## 2017-03-17 DIAGNOSIS — E237 Disorder of pituitary gland, unspecified: Secondary | ICD-10-CM | POA: Insufficient documentation

## 2017-03-17 NOTE — Patient Instructions (Signed)
Aim for 30 minutes of exercise most days. Rethink what you drink. Water is great! Aim for 2-3 Carb Choices per meal (30-45 grams) +/- 1 either way  Aim for 0-15 Carbs per snack if hungry  Include protein in moderation with your meals and snacks  Consider reading food labels for Total Carbohydrate and Fat Grams of foods  Consider checking BG at alternate times per day  Continue taking medication as directed Be mindful about how much sugar you are adding to beverages and other foods. Fruit Punch - find one with no sugar  Measure and decrease portions of carbohydrate foods  Make your plate and don't go back for seconds

## 2017-03-17 NOTE — Progress Notes (Signed)
Rachel Bowen  OFB:[id number]  POE:[id number]  DOB - 10/24/1965  Chief Complaint  Patient presents with  . Diabetes    ER follow up       Subjective:   Rachel Bowen is a 52 y.o. female here today for establishment of care. She has a past history of a thickened pituitary infundibulum and gestational diabetes. She presented to the emergency department on 03/08/2016 with complaints of fluctuating blood sugars and dizziness. She lost her insurance possibly one year ago and has been lost to follow-up for at least 2 years. She never was on any diabetes medications. Her blood sugar at home was reportedly 60 but in the emergency department was 93. Her CBC and BMP were okay. Her vital signs were okay. She basically was told that she needed to just obtain a primary care provider.  Since discharge she's not had any symptoms as above. No new complaints today. Really just wanted to establish care with a primary given her history.Nonsmoker. In between jobs, counselor with a BS degree.  [id number] GEN: denies fever or chills, denies change in weight Skin: denies lesions or rashes HEENT: denies headache, earache, epistaxis, sore throat, or neck pain LUNGS: denies SHOB, dyspnea, PND, orthopnea CV: denies CP or palpitations ABD: denies abd pain, N or V EXT: denies muscle spasms or swelling; no pain in lower ext, no weakness NEURO: denies numbness or tingling, denies sz, stroke or TIA  ALLERGIES: Allergies  Allergen Reactions  . Amoxicillin Nausea And Vomiting    Reaction: unknown    PAST MEDICAL HISTORY: Past Medical History:  Diagnosis Date  . Gestational diabetes     PAST SURGICAL HISTORY: No past surgical history on file.  MEDICATIONS AT HOME: Prior to Admission medications   Medication Sig Start Date End Date Taking? Authorizing Provider  OVER THE COUNTER MEDICATION Take 1 tablet by mouth daily.    [provider]   Social-divorced, nonsmoker, not  working  Objective:   Vitals:   03/17/17 0952  BP: 115/76  Pulse: 75  Temp: 97.9 F (36.6 C)  TempSrc: Oral  SpO2: 100%  Weight: 153 lb 12.8 oz (69.8 kg)  Height: 5' 2"  (1.575 m)  PF: 75 L/min    Exam General appearance : Awake, alert, not in any distress. Speech Clear. Not toxic looking HEENT: Atraumatic and Normocephalic, pupils equally reactive to light and accomodation Neck: supple, no JVD. No cervical lymphadenopathy.  Chest:Good air entry bilaterally, no added sounds  CVS: S1 S2 regular, no murmurs.  Neurology: Awake alert, and oriented X 3, CN II-XII intact, Non focal Skin:No Rash Wounds:N/A   Assessment & Plan  1. Pre DM  -Aim for 30 minutes of exercise most days. Rethink what you drink. Water is great! Aim for 2-3 Carb Choices per meal (30-45 grams) +/- 1 either way  Aim for 0-15 Carbs per snack if hungry  Include protein in moderation with your meals and snacks  Consider reading food labels for Total Carbohydrate and Fat Grams of foods  Consider checking BG at alternate times per day  Continue taking medication as directed Be mindful about how much sugar you are adding to beverages and other foods. Fruit Punch - find one with no sugar  Measure and decrease portions of carbohydrate foods  Make your plate and don't go back for seconds  -no meds indicated  2. Thickened Pituitary Infundibulum  -watch  -neuro eval if needed in future   Appt with financial counselor Return in about 4 weeks (  around 04/14/2017).   Total time spent with patient was 33 min. Greater than 50 % of this visit was spent face to face counseling and coordinating care regarding risk factor modification, compliance importance and encouragement, education related to diabetes.  This note has been created with Surveyor, quantity. Any transcriptional errors are unintentional.    Zettie Pho, PA-C [facility] and  [place], [place]   03/17/2017, 10:29 AM

## 2017-03-17 NOTE — Progress Notes (Signed)
BS- 85 A1C-5.8%

## 2017-04-18 ENCOUNTER — Ambulatory Visit: Payer: Self-pay | Attending: Nurse Practitioner | Admitting: Nurse Practitioner

## 2017-04-18 ENCOUNTER — Encounter: Payer: Self-pay | Admitting: Nurse Practitioner

## 2017-04-18 VITALS — BP 120/79 | HR 78 | Temp 98.8°F | Ht 62.0 in | Wt 155.0 lb

## 2017-04-18 DIAGNOSIS — E785 Hyperlipidemia, unspecified: Secondary | ICD-10-CM | POA: Insufficient documentation

## 2017-04-18 DIAGNOSIS — E1165 Type 2 diabetes mellitus with hyperglycemia: Secondary | ICD-10-CM | POA: Insufficient documentation

## 2017-04-18 DIAGNOSIS — Z1211 Encounter for screening for malignant neoplasm of colon: Secondary | ICD-10-CM | POA: Insufficient documentation

## 2017-04-18 DIAGNOSIS — Z8632 Personal history of gestational diabetes: Secondary | ICD-10-CM | POA: Insufficient documentation

## 2017-04-18 DIAGNOSIS — Z809 Family history of malignant neoplasm, unspecified: Secondary | ICD-10-CM | POA: Insufficient documentation

## 2017-04-18 DIAGNOSIS — Z880 Allergy status to penicillin: Secondary | ICD-10-CM | POA: Insufficient documentation

## 2017-04-18 DIAGNOSIS — R7303 Prediabetes: Secondary | ICD-10-CM | POA: Insufficient documentation

## 2017-04-18 DIAGNOSIS — Z1231 Encounter for screening mammogram for malignant neoplasm of breast: Secondary | ICD-10-CM

## 2017-04-18 LAB — POCT GLYCOSYLATED HEMOGLOBIN (HGB A1C): Hemoglobin A1C: 5.6

## 2017-04-18 LAB — GLUCOSE, POCT (MANUAL RESULT ENTRY): POC Glucose: 140 mg/dl — AB (ref 70–99)

## 2017-04-18 NOTE — Patient Instructions (Addendum)

## 2017-04-18 NOTE — Progress Notes (Signed)
Assessment & Plan:  Rachel Bowen was seen today for establish care.  Diagnoses and all orders for this visit:  Prediabetes -     Glucose (CBG) -     HgB A1c Continue blood sugar control as discussed in office today, low carbohydrate diet, and regular physical exercise as tolerated, 150 minutes per week (30 min each day, 5 days per week, or 50 min 3 days per week).   Breast cancer screening by mammogram -     MM SCREENING BREAST TOMO BILATERAL; Future  Colon cancer screening -     Ambulatory referral to Gastroenterology    Patient has been counseled on age-appropriate routine health concerns for screening and prevention. These are reviewed and up-to-date. Referrals have been placed accordingly. Immunizations are up-to-date or declined.    Subjective:   Chief Complaint  Patient presents with  . Establish Care    Patient is here to establish care for prediabetes. Patient check her blood sugar at home.    HPI Rachel Bowen 52 y.o. female presents to office today to establish care. She has a history of prediabetes, gestational diabetes and pituitary disorder (thickened pituitary infundibulum).  She has never taken any antidiabetic medication but checks her blood sugars (she purchased her own glucometer) twice a day. She endorses a few readings of 80s as lows and 140s as highs post prandial. A1c today is normal. I have instructed her to only check her blood sugar if she feels symptomatic.  She has no history of hypertension or hyperlipidemia.  Lab Results  Component Value Date   HGBA1C 5.6 04/18/2017   Review of Systems  Constitutional: Negative for fever, malaise/fatigue and weight loss.  Eyes: Negative.  Negative for blurred vision, double vision and photophobia.  Respiratory: Negative.  Negative for cough and shortness of breath.   Cardiovascular: Negative.  Negative for chest pain, palpitations and leg swelling.  Gastrointestinal: Negative.  Negative for heartburn, nausea and  vomiting.  Neurological: Negative.  Negative for dizziness, focal weakness, seizures and headaches.  Psychiatric/Behavioral: Negative.  Negative for suicidal ideas.    Past Medical History:  Diagnosis Date  . Gestational diabetes   . Hyperlipidemia   . Prediabetes     History reviewed. No pertinent surgical history.  Family History  Problem Relation Age of Onset  . Cancer Mother   . Cancer Father     Social History Reviewed with no changes to be made today.   Outpatient Medications Prior to Visit  Medication Sig Dispense Refill  . OVER THE COUNTER MEDICATION Take 1 tablet by mouth daily.     No facility-administered medications prior to visit.     Allergies  Allergen Reactions  . Amoxicillin Nausea And Vomiting    Reaction: unknown       Objective:    BP 120/79 (BP Location: Left Arm, Patient Position: Sitting, Cuff Size: Normal)   Pulse 78   Temp 98.8 F (37.1 C) (Oral)   Ht 5' 2"  (1.575 m)   Wt 155 lb (70.3 kg)   LMP 07/02/2012   SpO2 98%   BMI 28.35 kg/m  Wt Readings from Last 3 Encounters:  04/18/17 155 lb (70.3 kg)  03/17/17 153 lb 12.8 oz (69.8 kg)  03/08/17 158 lb (71.7 kg)    Physical Exam  Constitutional: She is oriented to person, place, and time. She appears well-developed and well-nourished. She is cooperative.  HENT:  Head: Normocephalic and atraumatic.  Eyes: EOM are normal.  Neck: Normal range of motion.  Cardiovascular: Normal rate, regular rhythm and normal heart sounds. Exam reveals no gallop and no friction rub.  No murmur heard. Pulmonary/Chest: Effort normal and breath sounds normal. No tachypnea. No respiratory distress. She has no decreased breath sounds. She has no wheezes. She has no rhonchi. She has no rales. She exhibits no tenderness.  Abdominal: Soft. Bowel sounds are normal.  Musculoskeletal: Normal range of motion. She exhibits no edema.  Neurological: She is alert and oriented to person, place, and time. Coordination  normal.  Skin: Skin is warm and dry.  Psychiatric: She has a normal mood and affect. Her behavior is normal. Judgment and thought content normal.  Nursing note and vitals reviewed.        Patient has been counseled extensively about nutrition and exercise as well as the importance of adherence with medications and regular follow-up. The patient was given clear instructions to go to ER or return to medical center if symptoms don't improve, worsen or new problems develop. The patient verbalized understanding.   Follow-up: Return in about 6 months (around 10/19/2017), or if symptoms worsen or fail to improve.   Gildardo Pounds, FNP-BC [facility] and [facility], [place]   04/18/2017, 12:59 PM

## 2017-06-05 ENCOUNTER — Ambulatory Visit
Admission: RE | Admit: 2017-06-05 | Discharge: 2017-06-05 | Disposition: A | Discharge status: Home / Self Care | Payer: Medicaid Other | Source: Ambulatory Visit | Attending: Nurse Practitioner | Admitting: Nurse Practitioner

## 2017-06-05 DIAGNOSIS — Z1231 Encounter for screening mammogram for malignant neoplasm of breast: Secondary | ICD-10-CM

## 2017-06-09 ENCOUNTER — Other Ambulatory Visit: Payer: Self-pay | Admitting: Nurse Practitioner

## 2017-06-09 DIAGNOSIS — R928 Other abnormal and inconclusive findings on diagnostic imaging of breast: Secondary | ICD-10-CM

## 2017-06-11 ENCOUNTER — Ambulatory Visit
Admission: RE | Admit: 2017-06-11 | Discharge: 2017-06-11 | Disposition: A | Discharge status: Home / Self Care | Payer: Medicaid Other | Source: Ambulatory Visit | Attending: Nurse Practitioner | Admitting: Nurse Practitioner

## 2017-06-11 ENCOUNTER — Ambulatory Visit: Payer: Medicaid Other

## 2017-06-11 DIAGNOSIS — R928 Other abnormal and inconclusive findings on diagnostic imaging of breast: Secondary | ICD-10-CM

## 2017-06-12 ENCOUNTER — Other Ambulatory Visit: Payer: Medicaid Other

## 2018-01-01 ENCOUNTER — Ambulatory Visit: Payer: Self-pay | Attending: Family Medicine | Admitting: Physician Assistant

## 2018-01-01 VITALS — BP 112/71 | HR 76 | Temp 98.0°F | Resp 18 | Ht 62.0 in | Wt 163.0 lb

## 2018-01-01 DIAGNOSIS — R7303 Prediabetes: Secondary | ICD-10-CM | POA: Insufficient documentation

## 2018-01-01 DIAGNOSIS — Z792 Long term (current) use of antibiotics: Secondary | ICD-10-CM | POA: Insufficient documentation

## 2018-01-01 DIAGNOSIS — Z880 Allergy status to penicillin: Secondary | ICD-10-CM | POA: Insufficient documentation

## 2018-01-01 DIAGNOSIS — N898 Other specified noninflammatory disorders of vagina: Secondary | ICD-10-CM | POA: Insufficient documentation

## 2018-01-01 LAB — POCT URINALYSIS DIP (CLINITEK)
Bilirubin, UA: NEGATIVE
Blood, UA: NEGATIVE
Glucose, UA: NEGATIVE mg/dL
Ketones, POC UA: NEGATIVE mg/dL
Leukocytes, UA: NEGATIVE
Nitrite, UA: NEGATIVE
POC PROTEIN,UA: NEGATIVE
Spec Grav, UA: 1.015 (ref 1.010–1.025)
Urobilinogen, UA: 0.2 E.U./dL
pH, UA: 5.5 (ref 5.0–8.0)

## 2018-01-01 MED ORDER — FLUCONAZOLE 150 MG PO TABS: 150.0000 mg | ORAL_TABLET | Freq: Once | ORAL | 0 refills | Status: AC

## 2018-01-01 MED ORDER — METRONIDAZOLE 500 MG PO TABS: 500.0000 mg | ORAL_TABLET | Freq: Two times a day (BID) | ORAL | 0 refills | Status: DC

## 2018-01-01 NOTE — Progress Notes (Signed)
Patient ID: Rachel Bowen, female   DOB: [birth year], 52 y.o.   MRN: [id number]   Rachel Bowen, is a 52 y.o. female  [id number]:[id number]  [id number]:[id number]  DOB - [birth year]  Subjective:  Chief Complaint and HPI: Rachel Bowen is a 52 y.o. female here today with c/o vaginal discharge and irritation for about 1 week.  Not really itching.  No recent antibiotics.  No fever.  No pelvic pain.  No urinary s/sx.  Denies STD risk factors.  No lesions.      ROS:   Constitutional:  No f/c, No night sweats, No unexplained weight loss. EENT:  No vision changes, No blurry vision, No hearing changes. No mouth, throat, or ear problems.  Respiratory: No cough, No SOB Cardiac: No CP, no palpitations GI:  No abd pain, No N/V/D. GU: No Urinary s/sx, + vaginal discharge and irritation Musculoskeletal: No joint pain Neuro: No headache, no dizziness, no motor weakness.  Skin: No rash Endocrine:  No polydipsia. No polyuria.  Psych: Denies SI/HI  No problems updated.  ALLERGIES: Allergies  Allergen Reactions  . Amoxicillin Nausea And Vomiting    Reaction: unknown    PAST MEDICAL HISTORY: Past Medical History:  Diagnosis Date  . Gestational diabetes   . Hyperlipidemia   . Prediabetes     MEDICATIONS AT HOME: Prior to Admission medications   Medication Sig Start Date End Date Taking? Authorizing Provider  OVER THE COUNTER MEDICATION Take 1 tablet by mouth daily.   Yes [provider]  fluconazole (DIFLUCAN) 150 MG tablet Take 1 tablet (150 mg total) by mouth once for 1 dose. 01/01/18 01/01/18  Argentina Donovan, PA-C  metroNIDAZOLE (FLAGYL) 500 MG tablet Take 1 tablet (500 mg total) by mouth 2 (two) times daily. 01/01/18   Argentina Donovan, PA-C     Objective:  EXAM:   Vitals:   01/01/18 1615  BP: 112/71  Pulse: 76  Resp: 18  Temp: 98 F (36.7 C)  TempSrc: Oral  SpO2: 99%  Weight: 163 lb (73.9 kg)  Height: 5' 2"  (1.575 m)    General appearance : A&OX3. NAD.  Non-toxic-appearing HEENT: Atraumatic and Normocephalic.  PERRLA. EOM intact. Neck: supple, no JVD. No cervical lymphadenopathy. No thyromegaly Chest/Lungs:  Breathing-non-labored, Good air entry bilaterally, breath sounds normal without rales, rhonchi, or wheezing  CVS: S1 S2 regular, no murmurs, gallops, rubs  Abdomen: Bowel sounds present, Non tender and not distended with no gaurding, rigidity or rebound. Extremities: Bilateral Lower Ext shows no edema, both legs are warm to touch with = pulse throughout Neurology:  CN II-XII grossly intact, Non focal.   Psych:  TP linear. J/I WNL. Normal speech. Appropriate eye contact and affect.  Skin:  No Rash  Data Review Lab Results  Component Value Date   HGBA1C 5.6 04/18/2017     Assessment & Plan   1. Vaginal irritation Will cover for BV and yeast(which would also cover trich) - POCT URINALYSIS DIP (CLINITEK) - metroNIDAZOLE (FLAGYL) 500 MG tablet; Take 1 tablet (500 mg total) by mouth 2 (two) times daily.  Dispense: 14 tablet; Refill: 0 - fluconazole (DIFLUCAN) 150 MG tablet; Take 1 tablet (150 mg total) by mouth once for 1 dose.  Dispense: 1 tablet; Refill: 0   Patient have been counseled extensively about nutrition and exercise  Return if symptoms worsen or fail to improve.  The patient was given clear instructions to go to ER or return to medical center if symptoms don't improve, worsen or new problems  develop. The patient verbalized understanding. The patient was told to call to get lab results if they haven't heard anything in the next week.     Freeman Caldron, PA-C [facility] and [place], [place]   01/01/2018, 4:33 PM

## 2018-01-01 NOTE — Patient Instructions (Signed)

## 2018-02-04 ENCOUNTER — Ambulatory Visit: Payer: Self-pay | Attending: Family Medicine | Admitting: Physician Assistant

## 2018-02-04 VITALS — BP 129/78 | HR 73 | Temp 98.3°F | Resp 16 | Wt 162.0 lb

## 2018-02-04 DIAGNOSIS — J069 Acute upper respiratory infection, unspecified: Secondary | ICD-10-CM

## 2018-02-04 DIAGNOSIS — R05 Cough: Secondary | ICD-10-CM | POA: Insufficient documentation

## 2018-02-04 DIAGNOSIS — R7303 Prediabetes: Secondary | ICD-10-CM | POA: Insufficient documentation

## 2018-02-04 DIAGNOSIS — Z880 Allergy status to penicillin: Secondary | ICD-10-CM | POA: Insufficient documentation

## 2018-02-04 DIAGNOSIS — E785 Hyperlipidemia, unspecified: Secondary | ICD-10-CM | POA: Insufficient documentation

## 2018-02-04 MED ORDER — BENZONATATE 100 MG PO CAPS: 200.0000 mg | ORAL_CAPSULE | Freq: Three times a day (TID) | ORAL | 0 refills | Status: DC | PRN

## 2018-02-04 MED ORDER — AZITHROMYCIN 250 MG PO TABS: ORAL_TABLET | ORAL | 0 refills | Status: DC

## 2018-02-04 MED ORDER — FLUCONAZOLE 150 MG PO TABS: 150.0000 mg | ORAL_TABLET | Freq: Once | ORAL | 0 refills | Status: AC

## 2018-02-04 MED FILL — AZITHROMYCIN 250 MG TABLET: 250 | 5 days supply | Qty: 6 | Fill #0

## 2018-02-04 MED FILL — FLUCONAZOLE 150 MG TABS: 150 | 1 days supply | Qty: 1 | Fill #0

## 2018-02-04 MED FILL — BENZONATATE 100 MG CAP: 100 | 6 days supply | Qty: 40 | Fill #0 | Status: TO

## 2018-02-04 NOTE — Progress Notes (Signed)
Patient ID: Rachel Bowen, female   DOB: 1965/04/29, 52 y.o.   MRN: [id number]   Rachel Bowen, is a 52 y.o. female  QPR:[id number]  [id number]:[id number]  DOB - 1965/08/14  Subjective:  Chief Complaint and HPI: Rachel Bowen is a 52 y.o. female here today for 2-3 week cough and congestion.  Cough is worse at night.  No fever.  She has been substitute school teaching.  +sinus congestion.  Mucus is yeloowish and thick.    ROS:   Constitutional:  No f/c, No night sweats, No unexplained weight loss. EENT:  No vision changes, No blurry vision, No hearing changes.  Respiratory: + cough, No SOB Cardiac: No CP, no palpitations GI:  No abd pain, No N/V/D. GU: No Urinary s/sx Musculoskeletal: No joint pain Neuro: No headache, no dizziness, no motor weakness.  Skin: No rash Endocrine:  No polydipsia. No polyuria.  Psych: Denies SI/HI  No problems updated.  ALLERGIES: Allergies  Allergen Reactions  . Amoxicillin Nausea And Vomiting    Reaction: unknown    PAST MEDICAL HISTORY: Past Medical History:  Diagnosis Date  . Gestational diabetes   . Hyperlipidemia   . Prediabetes     MEDICATIONS AT HOME: Prior to Admission medications   Medication Sig Start Date End Date Taking? Authorizing Provider  azithromycin (ZITHROMAX) 250 MG tablet Take 2 today then 1 daily 02/04/18   Freeman Caldron M, PA-C  benzonatate (TESSALON) 100 MG capsule Take 2 capsules (200 mg total) by mouth 3 (three) times daily as needed for cough. 02/04/18   Argentina Donovan, PA-C  fluconazole (DIFLUCAN) 150 MG tablet Take 1 tablet (150 mg total) by mouth once for 1 dose. If needed for yeast infection 02/04/18 02/04/18  Argentina Donovan, PA-C  metroNIDAZOLE (FLAGYL) 500 MG tablet Take 1 tablet (500 mg total) by mouth 2 (two) times daily. 01/01/18   Argentina Donovan, PA-C  OVER THE COUNTER MEDICATION Take 1 tablet by mouth daily.    [provider]     Objective:  EXAM:   Vitals:   02/04/18 1615  BP:  129/78  Pulse: 73  Resp: 16  Temp: 98.3 F (36.8 C)  TempSrc: Oral  SpO2: 98%  Weight: 162 lb (73.5 kg)    General appearance : A&OX3. NAD. Non-toxic-appearing HEENT: Atraumatic and Normocephalic.  PERRLA. EOM intact.  TM full B. Mouth-MMM, post pharynx WNL w/ mild erythema, + PND. Neck: supple, no JVD. No cervical lymphadenopathy. No thyromegaly Chest/Lungs:  Breathing-non-labored, Good air entry bilaterally, breath sounds normal without rales, rhonchi, or wheezing  CVS: S1 S2 regular, no murmurs, gallops, rubs  Extremities: Bilateral Lower Ext shows no edema, both legs are warm to touch with = pulse throughout Neurology:  CN II-XII grossly intact, Non focal.   Psych:  TP linear. J/I WNL. Normal speech. Appropriate eye contact and affect.  Skin:  No Rash  Data Review Lab Results  Component Value Date   HGBA1C 5.6 04/18/2017     Assessment & Plan   1. Upper respiratory tract infection, unspecified type Will cover for atypicals- - benzonatate (TESSALON) 100 MG capsule; Take 2 capsules (200 mg total) by mouth 3 (three) times daily as needed for cough.  Dispense: 40 capsule; Refill: 0 - azithromycin (ZITHROMAX) 250 MG tablet; Take 2 today then 1 daily  Dispense: 6 tablet; Refill: 0 - fluconazole (DIFLUCAN) 150 MG tablet; Take 1 tablet (150 mg total) by mouth once for 1 dose. If needed for yeast infection  Dispense: 1 tablet;  Refill: 0    Patient have been counseled extensively about nutrition and exercise  Return in about 2 months (around 04/07/2018) for Dr Margarita Rana;  bloodwork/prediabetes.  The patient was given clear instructions to go to ER or return to medical center if symptoms don't improve, worsen or new problems develop. The patient verbalized understanding. The patient was told to call to get lab results if they haven't heard anything in the next week.     Freeman Caldron, PA-C [facility] and [place], [place]     02/04/2018, 4:37 PM

## 2018-02-05 ENCOUNTER — Telehealth: Payer: Self-pay | Admitting: General Practice

## 2018-02-05 NOTE — Telephone Encounter (Signed)
I personally called and transferred the patients RX to [facility] on [address], it was given to pharmacist Robin.

## 2018-02-05 NOTE — Telephone Encounter (Signed)
Pt called stating that the [facility] pharmacy could not fill her cough Medicine and would like it re-sent to -[facility], [place],  [phone] Please follow up as soon as possible

## 2018-04-08 ENCOUNTER — Ambulatory Visit: Payer: Medicaid Other | Admitting: Family Medicine

## 2018-04-29 ENCOUNTER — Encounter (HOSPITAL_COMMUNITY): Payer: Self-pay

## 2018-04-29 ENCOUNTER — Other Ambulatory Visit: Payer: Self-pay

## 2018-04-29 ENCOUNTER — Emergency Department (HOSPITAL_COMMUNITY)
Admission: EM | Admit: 2018-04-29 | Discharge: 2018-04-29 | Disposition: A | Discharge status: Home / Self Care | Payer: Medicaid Other | Attending: Emergency Medicine | Admitting: Emergency Medicine

## 2018-04-29 DIAGNOSIS — J069 Acute upper respiratory infection, unspecified: Secondary | ICD-10-CM | POA: Insufficient documentation

## 2018-04-29 DIAGNOSIS — B349 Viral infection, unspecified: Secondary | ICD-10-CM

## 2018-04-29 DIAGNOSIS — Z87891 Personal history of nicotine dependence: Secondary | ICD-10-CM | POA: Insufficient documentation

## 2018-04-29 LAB — INFLUENZA PANEL BY PCR (TYPE A & B)
Influenza A By PCR: NEGATIVE
Influenza B By PCR: NEGATIVE

## 2018-04-29 LAB — GROUP A STREP BY PCR: Group A Strep by PCR: NOT DETECTED

## 2018-04-29 MED ORDER — ONDANSETRON 4 MG PO TBDP: 4.0000 mg | ORAL_TABLET | Freq: Three times a day (TID) | ORAL | 0 refills | Status: DC | PRN

## 2018-04-29 MED ORDER — ONDANSETRON 4 MG PO TBDP
4.0000 mg | ORAL_TABLET | Freq: Once | ORAL | Status: AC
  Administered 2018-04-29: 4 mg via ORAL
  Filled 2018-04-29: qty 1

## 2018-04-29 MED ORDER — ACETAMINOPHEN 325 MG PO TABS
650.0000 mg | ORAL_TABLET | Freq: Once | ORAL | Status: AC
  Administered 2018-04-29: 650 mg via ORAL
  Filled 2018-04-29: qty 2

## 2018-04-29 NOTE — ED Triage Notes (Addendum)
Pt states she has been having cold and hot flashes. Pt states that nauseated. Pt also c/o sore throat. Pt states she has been taking cough medicine as well without relief.

## 2018-04-29 NOTE — ED Notes (Signed)
Patient given water

## 2018-04-29 NOTE — Discharge Instructions (Signed)
Please read and follow all provided instructions.  Your diagnoses today include:  1. Viral syndrome   2. Viral upper respiratory tract infection     You appear to have an upper respiratory infection (URI). An upper respiratory tract infection, or cold, is a viral infection of the air passages leading to the lungs. It should improve gradually after 5-7 days. You may have a lingering cough that lasts for 2- 4 weeks after the infection.  Tests performed today include: Vital signs. See below for your results today.   Medications prescribed:   Take any prescribed medications only as directed. Treatment for your infection is aimed at treating the symptoms. There are no medications, such as antibiotics, that will cure your infection.   Please use the Zofran 1 tablet every 8 hours under the tongue as needed for nausea.  Please take ibuprofen a non-steroidal anti-inflammatory agent (NSAID) for pain. You may take 600 mg every 6 hours as needed for pain. If still requiring this medication around the clock for acute pain after 10 days, please see your primary healthcare provider.  Women who are pregnant, breastfeeding, or planning on becoming pregnant should not take non-steroidal anti-inflammatories such as Advil and Aleve. Tylenol is a safe over the counter pain reliever in pregnant women.  You may combine this medication with Tylenol, 650 mg every 6 hours, so you are receiving something for pain every 3 hours.  This is not a long-term medication unless under the care and direction of your primary provider. Taking this medication long-term and not under the supervision of a healthcare provider could increase the risk of stomach ulcers, kidney problems, and cardiovascular problems such as high blood pressure.    Home care instructions:  Follow any educational materials contained in this packet.   Your illness is contagious and can be spread to others, especially during the first 3 or 4 days. It  cannot be cured by antibiotics or other medicines. Take basic precautions such as washing your hands often, covering your mouth when you cough or sneeze, and avoiding public places where you could spread your illness to others.   Please continue drinking plenty of fluids.  Use over-the-counter medicines as needed as directed on packaging for symptom relief.  You may also use ibuprofen or tylenol as directed on packaging for pain or fever.  Do not take multiple medicines containing Tylenol or acetaminophen to avoid taking too much of this medication.  Follow-up instructions: Please follow-up with your primary care provider in the next 3 days for further evaluation of your symptoms if you are not feeling better.   Return instructions:  Please return to the Emergency Department if you experience worsening symptoms.  RETURN IMMEDIATELY IF you develop shortness of breath, chest pain, confusion or altered mental status, a new rash, become dizzy, faint, or poorly responsive, or are unable to be cared for at home. Please return if you have persistent vomiting and cannot keep down fluids or develop a fever that is not controlled by tylenol or motrin.   Please return if you have any other emergent concerns.  Additional Information:  Your vital signs today were: BP 107/87 (BP Location: Left Arm)    Pulse 81    Temp 99.3 F (37.4 C) (Oral)    Resp 16    Ht 5' 2"  (1.575 m)    Wt 69.9 kg    LMP 07/02/2012    SpO2 98%    BMI 28.17 kg/m  If your blood pressure (  BP) was elevated above 135/85 this visit, please have this repeated by your doctor within one month. --------------

## 2018-04-29 NOTE — ED Provider Notes (Signed)
[facility] DEPT Provider Note   CSN: [id number] Arrival date & time: 04/29/18  1044    History   Chief Complaint Chief Complaint  Patient presents with  . Generalized Body Aches    HPI Rachel Bowen is a 53 y.o. female.     HPI   Patient is a 53 year old female with a history of prediabetes and hyperlipidemia presenting for sore throat, dry cough, myalgias, and chills.  Patient reports that her symptoms began approximately 24 hours ago.  She reports that it started with sore throat and progressed to myalgias.  She reports that this morning she felt "dizzy" upon standing, but this quickly normalized after she maintained a standing position.  This again occurred when she was going from a seated to standing position later this morning.  Patient reports that her urine is dark yellow in color.  Patient denies any recorded fevers at home, but does feel "hot and cold".  She denies any difficulty breathing or difficulty swallowing patient has any chest pain or shortness of breath.  Patient has dry cough without hemoptysis or sputum production.  Patient has tried "cough syrup" as well as administering acetaminophen last night, but has not had any antipyretics and approximately 12 hours.  Patient did travel in the last 4 to 5 days to [place], [place] and [place] approximately [place] of [place].  She reports she was there for a large church event.  She denies any known sick contacts or receiving any information from the church that there were any positive exposures to COVID-19.  She has not traveled outside the country or the state in the last month.  Past Medical History:  Diagnosis Date  . Gestational diabetes   . Hyperlipidemia   . Prediabetes     Patient Active Problem List   Diagnosis Date Noted  . Prediabetes 04/18/2017    History reviewed. No pertinent surgical history.   OB History   No obstetric history on file.      Home Medications    Prior to Admission medications   Medication Sig Start Date End Date Taking? Authorizing Provider  azithromycin (ZITHROMAX) 250 MG tablet Take 2 today then 1 daily 02/04/18   Freeman Caldron M, PA-C  benzonatate (TESSALON) 100 MG capsule Take 2 capsules (200 mg total) by mouth 3 (three) times daily as needed for cough. 02/04/18   Argentina Donovan, PA-C  OVER THE COUNTER MEDICATION Take 1 tablet by mouth daily.    [provider]    Family History Family History  Problem Relation Age of Onset  . Cancer Mother   . Cancer Father     Social History Social History   Tobacco Use  . Smoking status: Former Research scientist (life sciences)  . Smokeless tobacco: Never Used  Substance Use Topics  . Alcohol use: No    Frequency: Never  . Drug use: No     Allergies   Amoxicillin   Review of Systems Review of Systems  Constitutional: Positive for chills. Negative for fever.  HENT: Positive for congestion, sinus pain and sore throat. Negative for trouble swallowing and voice change.   Respiratory: Positive for cough. Negative for shortness of breath.   Cardiovascular: Negative for chest pain.  Gastrointestinal: Negative for abdominal pain, nausea and vomiting.     Physical Exam Updated Vital Signs BP 107/87 (BP Location: Left Arm)   Pulse 81   Temp 99.3 F (37.4 C) (Oral)   Resp 16   Ht  5' 2"  (1.575 m)   Wt 69.9 kg   LMP 07/02/2012   SpO2 98%   BMI 28.17 kg/m   Physical Exam Vitals signs and nursing note reviewed.  Constitutional:      General: She is not in acute distress.    Appearance: She is well-developed. She is not diaphoretic.     Comments: Sitting comfortably in bed.  HENT:     Head: Normocephalic and atraumatic.     Mouth/Throat:     Comments: Normal phonation. No muffled voice sounds. Patient swallows secretions without difficulty. Dentition normal. No lesions of tongue or buccal mucosa. Uvula midline. No asymmetric swelling of the posterior  pharynx. Minimal erythema of posterior pharynx. No tonsillar exuduate. No lingual swelling. No induration inferior to tongue. No submandibular tenderness, swelling, or induration.  Tissues of the neck supple. No cervical lymphadenopathy. Right TM without erythema or effusion; left TM without erythema or effusion.  Eyes:     General:        Right eye: No discharge.        Left eye: No discharge.     Conjunctiva/sclera: Conjunctivae normal.     Comments: EOMs normal to gross examination.  Neck:     Musculoskeletal: Normal range of motion.  Cardiovascular:     Rate and Rhythm: Normal rate and regular rhythm.     Heart sounds: Normal heart sounds.  Pulmonary:     Effort: Pulmonary effort is normal.     Breath sounds: Normal breath sounds. No wheezing or rales.  Abdominal:     General: There is no distension.  Musculoskeletal: Normal range of motion.  Skin:    General: Skin is warm and dry.  Neurological:     Mental Status: She is alert.     Comments: Cranial nerves intact to gross observation. Patient moves extremities without difficulty.  Psychiatric:        Behavior: Behavior normal.        Thought Content: Thought content normal.        Judgment: Judgment normal.      ED Treatments / Results  Labs (all labs ordered are listed, but only abnormal results are displayed) Labs Reviewed  GROUP A STREP BY PCR  INFLUENZA PANEL BY PCR (TYPE A & B)    EKG None  Radiology No results found.  Procedures Procedures (including critical care time)  Medications Ordered in ED Medications  acetaminophen (TYLENOL) tablet 650 mg (650 mg Oral Given 04/29/18 1211)  ondansetron (ZOFRAN-ODT) disintegrating tablet 4 mg (4 mg Oral Given 04/29/18 1211)     Initial Impression / Assessment and Plan / ED Course  I have reviewed the triage vital signs and the nursing notes.  Pertinent labs & imaging results that were available during my care of the patient were reviewed by me and  considered in my medical decision making (see chart for details).  Clinical Course as of Apr 28 1252  Wed Apr 29, 2018  1251 Case was discussed with infection prevention RN, Judeen Hammans, who states that patient is low risk criteria at this time for 2019 novel coronavirus testing.  She states that at this time, patient symptoms and travel history or not warranting testing, however patient should be extensively counseled on return precautions.  She does recommend testing for flu.  I appreciate her involvement in the care of this patient.   [AM]    Clinical Course User Index [AM] Albesa Seen, PA-C       Patient nontoxic-appearing,  afebrile here, however with increasing temperature of 100.3, and in no acute distress.  Normal lung exam.  No concerns for pneumonia.  No signs in the throat of RPA or PTA.  Rapid strep is negative.  Influenza testing is negative. COVID-19 was considered in this patient given that she was [place] within the last 5 days and was discussed with infection prevention, however patient felt to be low risk and not meeting health systems criteria for testing at this time.  Per discussion of infection prevention, benefits of testing for COVID-19 seem low risk.  Patient reporting that she is having nausea and decreased p.o. intake as well as dark urine due to the lack of p.o. intake.  This improved after some Zofran.  She is tolerating p.o. in no acute distress.  I encouraged patient to push oral fluids to prevent orthostatic hypotension.  She was given extensive return precautions for any worsening cough, elevating fever, chest pain, shortness of breath, syncope or presyncope.  She was encouraged to monitor fever curve at home.  Patient is in understanding and agrees with the plan of care.  Final Clinical Impressions(s) / ED Diagnoses   Final diagnoses:  Viral syndrome  Viral upper respiratory tract infection    ED Discharge Orders         Ordered    ondansetron  (ZOFRAN ODT) 4 MG disintegrating tablet  Every 8 hours PRN     04/29/18 1315           Albesa Seen, PA-C 04/29/18 1317    Drenda Freeze, MD 04/29/18 [phone]

## 2018-12-11 ENCOUNTER — Encounter: Payer: Self-pay | Admitting: Nurse Practitioner

## 2018-12-11 ENCOUNTER — Ambulatory Visit: Payer: Self-pay | Attending: Nurse Practitioner | Admitting: Nurse Practitioner

## 2018-12-11 ENCOUNTER — Other Ambulatory Visit: Payer: Self-pay

## 2018-12-11 VITALS — BP 111/76 | HR 80 | Temp 98.7°F | Ht 62.0 in | Wt 164.0 lb

## 2018-12-11 DIAGNOSIS — Z114 Encounter for screening for human immunodeficiency virus [HIV]: Secondary | ICD-10-CM

## 2018-12-11 DIAGNOSIS — Z1211 Encounter for screening for malignant neoplasm of colon: Secondary | ICD-10-CM

## 2018-12-11 DIAGNOSIS — R7303 Prediabetes: Secondary | ICD-10-CM

## 2018-12-11 DIAGNOSIS — Z13220 Encounter for screening for lipoid disorders: Secondary | ICD-10-CM

## 2018-12-11 DIAGNOSIS — Z130 Encounter for screening for diseases of the blood and blood-forming organs and certain disorders involving the immune mechanism: Secondary | ICD-10-CM

## 2018-12-11 LAB — POCT GLYCOSYLATED HEMOGLOBIN (HGB A1C): Hemoglobin A1C: 5.7 % — AB (ref 4.0–5.6)

## 2018-12-11 LAB — GLUCOSE, POCT (MANUAL RESULT ENTRY): POC Glucose: 111 mg/dl — AB (ref 70–99)

## 2018-12-11 NOTE — Progress Notes (Signed)
Assessment & Plan:  Rachel Bowen was seen today for follow-up.  Diagnoses and all orders for this visit:  Prediabetes -     Glucose (CBG) -     HgB A1c -     CMP14+EGFR  Colon cancer screening -     Fecal occult blood, imunochemical([facility])  Encounter for screening for HIV -     HIV antibody (with reflex)  Lipid screening -     Lipid panel  Screening for deficiency anemia -     CBC    Patient has been counseled on age-appropriate routine health concerns for screening and prevention. These are reviewed and up-to-date. Referrals have been placed accordingly. Immunizations are up-to-date or declined.    Subjective:   Chief Complaint  Patient presents with  . Follow-up    Pt. is here for prediabetes follow up. Pt. request if she can test strip.   HPI Rachel Bowen 53 y.o. female presents to office today for follow up to Prediabetes. Currently diet and exercise controlled. Highest A1c 6.1. Today's a1c has increased to 5.7 from 5.4. Her weight is up over 10lbs.  She endorses symptoms of hypoglycemia (feeling shaky) when her blood sugars are "low" When I inquired into what is low she stated 120s for me. I instructed her that 120 is actually a normal blood glucose reading. She has been monitoring her blood glucose levels at home several times a day which I have instructed her is not necessary. Lipid panel pending. BP at goal.  Lab Results  Component Value Date   HGBA1C 5.7 (A) 12/11/2018   BP Readings from Last 3 Encounters:  12/11/18 111/76  04/29/18 112/79  02/04/18 129/78    Review of Systems  Constitutional: Negative for fever, malaise/fatigue and weight loss.  HENT: Negative.  Negative for nosebleeds.   Eyes: Negative.  Negative for blurred vision, double vision and photophobia.  Respiratory: Negative.  Negative for cough and shortness of breath.   Cardiovascular: Negative.  Negative for chest pain, palpitations and leg swelling.  Gastrointestinal: Negative.   Negative for heartburn, nausea and vomiting.  Musculoskeletal: Negative.  Negative for myalgias.  Neurological: Negative.  Negative for dizziness, focal weakness, seizures and headaches.  Psychiatric/Behavioral: Negative.  Negative for suicidal ideas.    Past Medical History:  Diagnosis Date  . Gestational diabetes   . Hyperlipidemia   . Prediabetes     Past Surgical History:  Procedure Laterality Date  . NO PAST SURGERIES      Family History  Problem Relation Age of Onset  . Cancer Mother   . Cancer Father     Social History Reviewed with no changes to be made today.   Outpatient Medications Prior to Visit  Medication Sig Dispense Refill  . OVER THE COUNTER MEDICATION Take 1 tablet by mouth daily.    Marland Kitchen azithromycin (ZITHROMAX) 250 MG tablet Take 2 today then 1 daily (Patient not taking: Reported on 12/11/2018) 6 tablet 0  . benzonatate (TESSALON) 100 MG capsule Take 2 capsules (200 mg total) by mouth 3 (three) times daily as needed for cough. (Patient not taking: Reported on 12/11/2018) 40 capsule 0  . ondansetron (ZOFRAN ODT) 4 MG disintegrating tablet Take 1 tablet (4 mg total) by mouth every 8 (eight) hours as needed for nausea or vomiting. (Patient not taking: Reported on 12/11/2018) 12 tablet 0   No facility-administered medications prior to visit.     Allergies  Allergen Reactions  . Amoxicillin Nausea And Vomiting    Reaction:  unknown       Objective:    BP 111/76 (BP Location: Left Arm, Patient Position: Sitting, Cuff Size: Normal)   Pulse 80   Temp 98.7 F (37.1 C) (Oral)   Ht 5' 2"  (1.575 m)   Wt 164 lb (74.4 kg)   LMP 07/02/2012   SpO2 97%   BMI 30.00 kg/m  Wt Readings from Last 3 Encounters:  12/11/18 164 lb (74.4 kg)  04/29/18 154 lb (69.9 kg)  02/04/18 162 lb (73.5 kg)    Physical Exam Vitals signs and nursing note reviewed.  Constitutional:      Appearance: She is well-developed.  HENT:     Head: Normocephalic and atraumatic.  Neck:      Musculoskeletal: Normal range of motion.  Cardiovascular:     Rate and Rhythm: Normal rate and regular rhythm.     Heart sounds: Normal heart sounds. No murmur. No friction rub. No gallop.   Pulmonary:     Effort: Pulmonary effort is normal. No tachypnea or respiratory distress.     Breath sounds: Normal breath sounds. No decreased breath sounds, wheezing, rhonchi or rales.  Chest:     Chest wall: No tenderness.  Abdominal:     General: Bowel sounds are normal.     Palpations: Abdomen is soft.  Musculoskeletal: Normal range of motion.  Skin:    General: Skin is warm and dry.  Neurological:     Mental Status: She is alert and oriented to person, place, and time.     Coordination: Coordination normal.  Psychiatric:        Behavior: Behavior normal. Behavior is cooperative.        Thought Content: Thought content normal.        Judgment: Judgment normal.          Patient has been counseled extensively about nutrition and exercise as well as the importance of adherence with medications and regular follow-up. The patient was given clear instructions to go to ER or return to medical center if symptoms don't improve, worsen or new problems develop. The patient verbalized understanding.   Follow-up: Return for schedule PAP at [facility] on November 11th at 250.   Gildardo Pounds, FNP-BC [facility] and [facility], [place]   12/11/2018, 4:20 PM

## 2018-12-12 LAB — CMP14+EGFR
ALT: 21 IU/L (ref 0–32)
AST: 19 IU/L (ref 0–40)
Albumin/Globulin Ratio: 1.6 (ref 1.2–2.2)
Albumin: 4.3 g/dL (ref 3.8–4.9)
Alkaline Phosphatase: 73 IU/L (ref 39–117)
BUN/Creatinine Ratio: 18 (ref 9–23)
BUN: 13 mg/dL (ref 6–24)
Bilirubin Total: <0.2 mg/dL (ref 0.0–1.2)
CO2: 26 mmol/L (ref 20–29)
Calcium: 9.8 mg/dL (ref 8.7–10.2)
Chloride: 105 mmol/L (ref 96–106)
Creatinine, Ser: 0.74 mg/dL (ref 0.57–1.00)
GFR calc Af Amer: 106 mL/min/{1.73_m2} (ref >59)
GFR calc non Af Amer: 92 mL/min/{1.73_m2} (ref >59)
Globulin, Total: 2.7 g/dL (ref 1.5–4.5)
Glucose: 112 mg/dL — ABNORMAL HIGH (ref 65–99)
Potassium: 4.4 mmol/L (ref 3.5–5.2)
Sodium: 143 mmol/L (ref 134–144)
Total Protein: 7 g/dL (ref 6.0–8.5)

## 2018-12-12 LAB — LIPID PANEL
Chol/HDL Ratio: 2.4 ratio (ref 0.0–4.4)
Cholesterol, Total: 200 mg/dL — ABNORMAL HIGH (ref 100–199)
HDL: 84 mg/dL (ref >39)
LDL Chol Calc (NIH): 104 mg/dL — ABNORMAL HIGH (ref 0–99)
Triglycerides: 68 mg/dL (ref 0–149)
VLDL Cholesterol Cal: 12 mg/dL (ref 5–40)

## 2018-12-12 LAB — HIV ANTIBODY (ROUTINE TESTING W REFLEX): HIV Screen 4th Generation wRfx: NONREACTIVE

## 2018-12-12 LAB — CBC
Hematocrit: 39.4 % (ref 34.0–46.6)
Hemoglobin: 13.2 g/dL (ref 11.1–15.9)
MCH: 28.8 pg (ref 26.6–33.0)
MCHC: 33.5 g/dL (ref 31.5–35.7)
MCV: 86 fL (ref 79–97)
Platelets: 218 10*3/uL (ref 150–450)
RBC: 4.59 x10E6/uL (ref 3.77–5.28)
RDW: 13.2 % (ref 11.7–15.4)
WBC: 5.3 10*3/uL (ref 3.4–10.8)

## 2018-12-18 LAB — FECAL OCCULT BLOOD, IMMUNOCHEMICAL: Fecal Occult Bld: POSITIVE — AB

## 2018-12-21 ENCOUNTER — Other Ambulatory Visit: Payer: Self-pay | Admitting: Nurse Practitioner

## 2018-12-21 DIAGNOSIS — R195 Other fecal abnormalities: Secondary | ICD-10-CM

## 2018-12-22 ENCOUNTER — Encounter: Payer: Self-pay | Admitting: Internal Medicine

## 2018-12-30 ENCOUNTER — Ambulatory Visit: Payer: Medicaid Other

## 2019-01-06 ENCOUNTER — Ambulatory Visit: Payer: Medicaid Other

## 2019-01-19 ENCOUNTER — Ambulatory Visit: Payer: Medicaid Other | Attending: Nurse Practitioner

## 2019-01-19 ENCOUNTER — Other Ambulatory Visit: Payer: Self-pay

## 2019-01-26 ENCOUNTER — Encounter: Payer: Self-pay | Admitting: Internal Medicine

## 2019-01-26 ENCOUNTER — Ambulatory Visit (INDEPENDENT_AMBULATORY_CARE_PROVIDER_SITE_OTHER): Payer: Self-pay | Admitting: Internal Medicine

## 2019-01-26 VITALS — BP 110/70 | HR 79 | Temp 97.1°F | Ht 62.0 in | Wt 167.0 lb

## 2019-01-26 DIAGNOSIS — R195 Other fecal abnormalities: Secondary | ICD-10-CM

## 2019-01-26 DIAGNOSIS — Z1159 Encounter for screening for other viral diseases: Secondary | ICD-10-CM

## 2019-01-26 NOTE — Progress Notes (Signed)
HISTORY OF PRESENT ILLNESS:  Rachel Bowen is a healthy 53 y.o. female who was sent by her primary care provider regarding Hemoccult positive stool.  Patient tells me that she has had chronic constipation for many years for which she takes over-the-counter suppository laxatives.  She recently underwent routine comprehensive evaluation.  Hemoccult testing performed December 12, 2018 returned positive.  Review of blood work from December 11, 2018 finds unremarkable comprehensive metabolic panel and CBC with hemoglobin 13.2.  Patient's GI review of systems is otherwise remarkable only for gas.  She does have a family history of colon cancer in her grandfather greater than age 28.  She smoked remotely.  No alcohol.  No regular medication  REVIEW OF SYSTEMS:  All non-GI ROS negative unless otherwise stated in the HPI.  Past Medical History:  Diagnosis Date  . Gestational diabetes   . Hyperlipidemia   . Prediabetes     Past Surgical History:  Procedure Laterality Date  . NO PAST SURGERIES      Social History Santasia Bowen  reports that she has quit smoking. Her smoking use included cigarettes. She has never used smokeless tobacco. She reports that she does not drink alcohol or use drugs.  family history includes Colon cancer in her maternal grandfather; Lung cancer in her mother; Prostate cancer in her father.  Allergies  Allergen Reactions  . Amoxicillin Nausea And Vomiting    Reaction: unknown       PHYSICAL EXAMINATION: Vital signs: BP 110/70   Pulse 79   Temp (!) 97.1 F (36.2 C)   Ht 5' 2"  (1.575 m)   Wt 167 lb (75.8 kg)   LMP 07/02/2012   BMI 30.54 kg/m   Constitutional: generally well-appearing, no acute distress Psychiatric: alert and oriented x3, cooperative Eyes: extraocular movements intact, anicteric, conjunctiva pink Mouth: oral pharynx moist, no lesions Neck: supple no lymphadenopathy Cardiovascular: heart regular rate and rhythm, no murmur Lungs: clear to  auscultation bilaterally Abdomen: soft, nontender, nondistended, no obvious ascites, no peritoneal signs, normal bowel sounds, no organomegaly Rectal: Deferred until colonoscopy Extremities: no clubbing, cyanosis, or lower extremity edema bilaterally Skin: no lesions on visible extremities Neuro: No focal deficits. No asterixis.    ASSESSMENT:  1.  Hemoccult positive stool.  Normal hemoglobin.  Rule out GI mucosal lesion 2.  Chronic stable constipation 3.  Family history of colon cancer in grandfather at advanced age   PLAN:  16.  Colonoscopy.The nature of the procedure, as well as the risks, benefits, and alternatives were carefully and thoroughly reviewed with the patient. Ample time for discussion and questions allowed. The patient understood, was satisfied, and agreed to proceed. 2.  Continue with laxative of choice

## 2019-01-26 NOTE — Patient Instructions (Signed)
You have been scheduled for a colonoscopy. Please follow written instructions given to you at your visit today.  Please pick up your prep supplies at the pharmacy within the next 1-3 days. If you use inhalers (even only as needed), please bring them with you on the day of your procedure.

## 2019-02-03 ENCOUNTER — Ambulatory Visit (INDEPENDENT_AMBULATORY_CARE_PROVIDER_SITE_OTHER): Payer: Self-pay

## 2019-02-03 ENCOUNTER — Other Ambulatory Visit: Payer: Self-pay | Admitting: Internal Medicine

## 2019-02-03 ENCOUNTER — Telehealth: Payer: Self-pay | Admitting: Internal Medicine

## 2019-02-03 DIAGNOSIS — Z1159 Encounter for screening for other viral diseases: Secondary | ICD-10-CM

## 2019-02-03 NOTE — Telephone Encounter (Signed)
Patient is calling- has a colon procedure Friday 12/18- she states she did not read the instructions until now and on Sunday she ate a hamburger with spinach on it- and yesterday ate peas with her dinner. And she has had a little bit of popcorn. I told her that she would need to reschedule but the days Dr. Henrene Pastor has are all afternoon appointments and she states that she is pre diabetic and that when she does not eat she can feel her sugar drop sometimes- she is asking if she would be okay with an afternoon appointment or if Dr. Henrene Pastor thinks she would be okay. And she is also asking if she will be okay waiting til Jan. For her colon since she did have some bleeding.

## 2019-02-03 NOTE — Telephone Encounter (Signed)
Spoke with pt and let her know to keep her appointment as scheduled but to follow the diet now like she was supposed to, pt verbalized understanding.

## 2019-02-04 LAB — SARS CORONAVIRUS 2 (TAT 6-24 HRS): SARS Coronavirus 2: NEGATIVE

## 2019-02-05 ENCOUNTER — Other Ambulatory Visit: Payer: Self-pay

## 2019-02-05 ENCOUNTER — Encounter: Payer: Self-pay | Admitting: Internal Medicine

## 2019-02-05 ENCOUNTER — Ambulatory Visit (AMBULATORY_SURGERY_CENTER): Payer: Self-pay | Admitting: Internal Medicine

## 2019-02-05 VITALS — BP 120/73 | HR 72 | Temp 97.8°F | Resp 17 | Ht 62.0 in | Wt 167.0 lb

## 2019-02-05 DIAGNOSIS — K5190 Ulcerative colitis, unspecified, without complications: Secondary | ICD-10-CM

## 2019-02-05 DIAGNOSIS — R195 Other fecal abnormalities: Secondary | ICD-10-CM

## 2019-02-05 DIAGNOSIS — K529 Noninfective gastroenteritis and colitis, unspecified: Secondary | ICD-10-CM

## 2019-02-05 MED ORDER — SODIUM CHLORIDE 0.9 % IV SOLN: 500.0000 mL | Freq: Once | INTRAVENOUS | Status: DC

## 2019-02-05 NOTE — Progress Notes (Signed)
Called to room to assist during endoscopic procedure.  Patient ID and intended procedure confirmed with present staff. Received instructions for my participation in the procedure from the performing physician.  

## 2019-02-05 NOTE — Op Note (Signed)
[place] Patient Name: Rachel Bowen Procedure Date: 02/05/2019 10:23 AM MRN: [id number] Endoscopist: Docia Chuck. Henrene Pastor , MD Age: 53 Referring MD:  Date of Birth: [birth year] Gender: Female Account #: [id number] Procedure:                Colonoscopy with biopsies. Indications:              Heme positive stool. Reports colonoscopy in                            [place] approximately 8 years ago. Reports                            polyps. No details Medicines:                Monitored Anesthesia Care Procedure:                Pre-Anesthesia Assessment:                           - Prior to the procedure, a History and Physical                            was performed, and patient medications and                            allergies were reviewed. The patient's tolerance of                            previous anesthesia was also reviewed. The risks                            and benefits of the procedure and the sedation                            options and risks were discussed with the patient.                            All questions were answered, and informed consent                            was obtained. Prior Anticoagulants: The patient has                            taken no previous anticoagulant or antiplatelet                            agents. ASA Grade Assessment: I - A normal, healthy                            patient. After reviewing the risks and benefits,                            the patient was deemed in satisfactory condition to  undergo the procedure.                           After obtaining informed consent, the colonoscope                            was passed under direct vision. Throughout the                            procedure, the patient's blood pressure, pulse, and                            oxygen saturations were monitored continuously. The                            Colonoscope was introduced through the anus and                             advanced to the the cecum, identified by                            appendiceal orifice and ileocecal valve. The                            terminal ileum, ileocecal valve, appendiceal                            orifice, and rectum were photographed. The quality                            of the bowel preparation was excellent. The                            colonoscopy was performed without difficulty. The                            patient tolerated the procedure well. The bowel                            preparation used was SUPREP via split dose                            instruction. Scope In: 10:31:43 AM Scope Out: 10:46:01 AM Scope Withdrawal Time: 0 hours 10 minutes 29 seconds  Total Procedure Duration: 0 hours 14 minutes 18 seconds  Findings:                 The terminal ileum appeared normal.                           Diffuse moderate inflammation characterized by                            erythema, friability, granularity and aphthous  ulcerations was found in the cecum and ascending                            colon. Changes are most consistent with                            inflammatory bowel disease. Biopsies were taken                            with a cold forceps for histology. In addition, few                            tiny aphthous ulcers were noted in the rectum.                            Biopsies were taken                           The exam was otherwise without abnormality on                            direct and retroflexion views. Specifically, the                            transverse colon, descending colon, and sigmoid                            colon appeared grossly normal. Biopsies were taken Complications:            No immediate complications. Estimated blood loss:                            None. Estimated Blood Loss:     Estimated blood loss: none. Impression:               1. Moderate right-sided  colitis                           2. Very mild rectal colitis                           3. Normal colonic mucosa throughout otherwise                           4. Normal terminal ileum Recommendation:           1. Await pathology results.                           2. Resume previous diet and current medications                           3. Office follow-up appointment with Dr. Henrene Pastor in                            few weeks to discuss the findings and additional  recommendations. Docia Chuck. Henrene Pastor, MD 02/05/2019 10:57:24 AM This report has been signed electronically.

## 2019-02-05 NOTE — Progress Notes (Signed)
A and O x3. Report to RN. Tolerated MAC anesthesia well.

## 2019-02-05 NOTE — Patient Instructions (Signed)
Await pathology results.  Office follow up with Dr. Henrene Pastor in 2-3 weeks to discuss findings.  YOU HAD AN ENDOSCOPIC PROCEDURE TODAY AT [facility]:   Refer to the procedure report that was given to you for any specific questions about what was found during the examination.  If the procedure report does not answer your questions, please call your gastroenterologist to clarify.  If you requested that your care partner not be given the details of your procedure findings, then the procedure report has been included in a sealed envelope for you to review at your convenience later.  YOU SHOULD EXPECT: Some feelings of bloating in the abdomen. Passage of more gas than usual.  Walking can help get rid of the air that was put into your GI tract during the procedure and reduce the bloating. If you had a lower endoscopy (such as a colonoscopy or flexible sigmoidoscopy) you may notice spotting of blood in your stool or on the toilet paper. If you underwent a bowel prep for your procedure, you may not have a normal bowel movement for a few days.  Please Note:  You might notice some irritation and congestion in your nose or some drainage.  This is from the oxygen used during your procedure.  There is no need for concern and it should clear up in a day or so.  SYMPTOMS TO REPORT IMMEDIATELY:   Following lower endoscopy (colonoscopy or flexible sigmoidoscopy):  Excessive amounts of blood in the stool  Significant tenderness or worsening of abdominal pains  Swelling of the abdomen that is new, acute  Fever of 100F or higher  For urgent or emergent issues, a gastroenterologist can be reached at any hour by calling [phone].   DIET:  We do recommend a small meal at first, but then you may proceed to your regular diet.  Drink plenty of fluids but you should avoid alcoholic beverages for 24 hours.  ACTIVITY:  You should plan to take it easy for the rest of today and you should NOT DRIVE or  use heavy machinery until tomorrow (because of the sedation medicines used during the test).    FOLLOW UP: Our staff will call the number listed on your records 48-72 hours following your procedure to check on you and address any questions or concerns that you may have regarding the information given to you following your procedure. If we do not reach you, we will leave a message.  We will attempt to reach you two times.  During this call, we will ask if you have developed any symptoms of COVID 19. If you develop any symptoms (ie: fever, flu-like symptoms, shortness of breath, cough etc.) before then, please call [phone].  If you test positive for Covid 19 in the 2 weeks post procedure, please call and report this information to [place].    If any biopsies were taken you will be contacted by phone or by letter within the next 1-3 weeks.  Please call us at [phone] if you have not heard about the biopsies in 3 weeks.    SIGNATURES/CONFIDENTIALITY: You and/or your care partner have signed paperwork which will be entered into your electronic medical record.  These signatures attest to the fact that that the information above on your After Visit Summary has been reviewed and is understood.  Full responsibility of the confidentiality of this discharge information lies with you and/or your care-partner.

## 2019-02-05 NOTE — Progress Notes (Signed)
Vitals by CW, Temp by JB, IV by [facility]

## 2019-02-09 ENCOUNTER — Telehealth: Payer: Self-pay | Admitting: Nurse Practitioner

## 2019-02-09 ENCOUNTER — Telehealth: Payer: Self-pay

## 2019-02-09 NOTE — Telephone Encounter (Signed)
Called [phone] and left a message we tried to reach pt for a follow up call. maw

## 2019-02-09 NOTE — Telephone Encounter (Signed)
I received an e-mail from  [facility], Patient has savings account ending in [id number].Marland Kitchen Patient would need to provide 90 days of complete bank statements for this account to complete cafa process.  Will hold for 14 days for patient to provide requested information, Pt was called an informed of the bank statement we need to complete her appliaction

## 2019-02-09 NOTE — Telephone Encounter (Signed)
Called [phone] and left a message we tried to reach pt for a follow up call. maw

## 2019-02-09 NOTE — Telephone Encounter (Signed)
Pt said she is returning your call and requesting a call back  [phone]

## 2019-02-10 ENCOUNTER — Encounter: Payer: Self-pay | Admitting: Internal Medicine

## 2019-03-02 NOTE — Telephone Encounter (Signed)
03-02-2019 I called and left a message for pt to call me back if still has a question.

## 2019-03-05 ENCOUNTER — Ambulatory Visit: Payer: Medicaid Other | Attending: Internal Medicine

## 2019-03-05 DIAGNOSIS — Z20822 Contact with and (suspected) exposure to covid-19: Secondary | ICD-10-CM

## 2019-03-06 LAB — NOVEL CORONAVIRUS, NAA: SARS-CoV-2, NAA: NOT DETECTED

## 2019-03-09 ENCOUNTER — Encounter: Payer: Self-pay | Admitting: Internal Medicine

## 2019-03-09 ENCOUNTER — Ambulatory Visit (INDEPENDENT_AMBULATORY_CARE_PROVIDER_SITE_OTHER): Payer: Self-pay | Admitting: Internal Medicine

## 2019-03-09 VITALS — BP 110/66 | HR 68 | Temp 97.6°F | Ht 62.0 in | Wt 167.0 lb

## 2019-03-09 DIAGNOSIS — R195 Other fecal abnormalities: Secondary | ICD-10-CM

## 2019-03-09 DIAGNOSIS — K5190 Ulcerative colitis, unspecified, without complications: Secondary | ICD-10-CM

## 2019-03-09 NOTE — Progress Notes (Signed)
HISTORY OF PRESENT ILLNESS:  Rachel Bowen is a 54 y.o. female who was seen in this office January 26, 2019 regarding Hemoccult positive stool.  Normal hemoglobin at that time.  Her only GI complaint was chronic stable constipation.  She subsequently underwent complete colonoscopy February 05, 2019.  She was found to have moderate right-sided colitis as well as mild rectal colitis.  The intervening rectal mucosa was normal as was the terminal ileum.  Biopsies of the right colon and rectum revealed chronic active colitis without granulomata or dysplasia.  The intervening normal-appearing mucosal biopsies were normal.  The findings were said to be nonspecific.  Patient presents for follow-up at this time as requested.  She does take BC powders with some regularity.  No other medications or NSAIDs.  She tells me that with exercise and hydration her constipation has improved.  No new problems or issues.  GI review of systems remains otherwise unremarkable  REVIEW OF SYSTEMS:  All non-GI ROS negative unless otherwise stated in the HPI.  Past Medical History:  Diagnosis Date  . Gestational diabetes   . Hyperlipidemia   . Prediabetes     Past Surgical History:  Procedure Laterality Date  . NO PAST SURGERIES      Social History Rachel Bowen  reports that she has quit smoking. Her smoking use included cigarettes. She has never used smokeless tobacco. She reports that she does not drink alcohol or use drugs.  family history includes Colon cancer in her maternal grandfather; Lung cancer in her mother; Prostate cancer in her father.  Allergies  Allergen Reactions  . Amoxicillin Nausea And Vomiting    Reaction: unknown       PHYSICAL EXAMINATION: Vital signs: BP 110/66   Pulse 68   Temp 97.6 F (36.4 C)   Ht 5' 2"  (1.575 m)   Wt 167 lb (75.8 kg)   LMP 07/02/2012   BMI 30.54 kg/m   Constitutional: generally well-appearing, no acute distress Psychiatric: alert and oriented x3,  cooperative Eyes: Anicteric Mouth: Mask Abdomen: Benign Neuro: No focal deficits.   ASSESSMENT:  1.  Mild to moderate right-sided mucosal colitis and rectal colitis.  Possibly related to NSAIDs versus idiopathic IBD.  Asymptomatic without anemia   PLAN:  1.  I discussed with her the differential diagnosis.  We discussed the implications of each diagnosis.  To that end I have encouraged her to avoid NSAIDs.  I would like to see her back in the office in 1 year to reassess her clinical status and obtain follow-up blood work including CBC and C-reactive protein.  I did advise her to contact me in the interim should she develop any relevant GI symptoms such as diarrhea, abdominal pain, or bleeding.  She agrees. Total time of 20 minutes was spent preparing to see the patient, obtaining the history and performing the medically appropriate physical exam, counseling regarding the endoscopic findings, arrange a follow-up plan and documenting the clinical information in the health record.

## 2019-03-09 NOTE — Patient Instructions (Signed)
Please follow up in one year

## 2019-04-22 ENCOUNTER — Other Ambulatory Visit: Payer: Self-pay

## 2019-04-22 ENCOUNTER — Encounter (HOSPITAL_COMMUNITY): Payer: Self-pay | Admitting: Emergency Medicine

## 2019-04-22 ENCOUNTER — Emergency Department (HOSPITAL_COMMUNITY): Payer: Self-pay

## 2019-04-22 ENCOUNTER — Emergency Department (HOSPITAL_COMMUNITY)
Admission: EM | Admit: 2019-04-22 | Discharge: 2019-04-22 | Disposition: A | Discharge status: Home / Self Care | Payer: Self-pay | Attending: Emergency Medicine | Admitting: Emergency Medicine

## 2019-04-22 DIAGNOSIS — K625 Hemorrhage of anus and rectum: Secondary | ICD-10-CM | POA: Insufficient documentation

## 2019-04-22 DIAGNOSIS — Z87891 Personal history of nicotine dependence: Secondary | ICD-10-CM | POA: Insufficient documentation

## 2019-04-22 DIAGNOSIS — R200 Anesthesia of skin: Secondary | ICD-10-CM | POA: Insufficient documentation

## 2019-04-22 DIAGNOSIS — D496 Neoplasm of unspecified behavior of brain: Secondary | ICD-10-CM | POA: Insufficient documentation

## 2019-04-22 LAB — COMPREHENSIVE METABOLIC PANEL
ALT: 25 U/L (ref 0–44)
AST: 25 U/L (ref 15–41)
Albumin: 3.9 g/dL (ref 3.5–5.0)
Alkaline Phosphatase: 58 U/L (ref 38–126)
Anion gap: 9 (ref 5–15)
BUN: 17 mg/dL (ref 6–20)
CO2: 24 mmol/L (ref 22–32)
Calcium: 9 mg/dL (ref 8.9–10.3)
Chloride: 107 mmol/L (ref 98–111)
Creatinine, Ser: 0.82 mg/dL (ref 0.44–1.00)
GFR calc Af Amer: >60 mL/min (ref >60)
GFR calc non Af Amer: >60 mL/min (ref >60)
Glucose, Bld: 89 mg/dL (ref 70–99)
Potassium: 3.8 mmol/L (ref 3.5–5.1)
Sodium: 140 mmol/L (ref 135–145)
Total Bilirubin: 0.1 mg/dL — ABNORMAL LOW (ref 0.3–1.2)
Total Protein: 7.1 g/dL (ref 6.5–8.1)

## 2019-04-22 LAB — CBC WITH DIFFERENTIAL/PLATELET
Abs Immature Granulocytes: 0.04 10*3/uL (ref 0.00–0.07)
Basophils Absolute: 0 10*3/uL (ref 0.0–0.1)
Basophils Relative: 0 %
Eosinophils Absolute: 0 10*3/uL (ref 0.0–0.5)
Eosinophils Relative: 0 %
HCT: 31.2 % — ABNORMAL LOW (ref 36.0–46.0)
Hemoglobin: 9.8 g/dL — ABNORMAL LOW (ref 12.0–15.0)
Immature Granulocytes: 1 %
Lymphocytes Relative: 29 %
Lymphs Abs: 2.2 10*3/uL (ref 0.7–4.0)
MCH: 29.1 pg (ref 26.0–34.0)
MCHC: 31.4 g/dL (ref 30.0–36.0)
MCV: 92.6 fL (ref 80.0–100.0)
Monocytes Absolute: 0.6 10*3/uL (ref 0.1–1.0)
Monocytes Relative: 8 %
Neutro Abs: 4.5 10*3/uL (ref 1.7–7.7)
Neutrophils Relative %: 62 %
Platelets: 232 10*3/uL (ref 150–400)
RBC: 3.37 MIL/uL — ABNORMAL LOW (ref 3.87–5.11)
RDW: 14.7 % (ref 11.5–15.5)
WBC: 7.3 10*3/uL (ref 4.0–10.5)
nRBC: 0.3 % — ABNORMAL HIGH (ref 0.0–0.2)

## 2019-04-22 MED ORDER — DEXAMETHASONE 4 MG PO TABS: 4.0000 mg | ORAL_TABLET | Freq: Once | ORAL | 0 refills | Status: AC

## 2019-04-22 MED ORDER — GADOBUTROL 1 MMOL/ML IV SOLN
6.0000 mL | Freq: Once | INTRAVENOUS | Status: AC | PRN
  Administered 2019-04-22: 5 mL via INTRAVENOUS

## 2019-04-22 NOTE — ED Provider Notes (Signed)
  Physical Exam  BP 113/71 (BP Location: Left Arm)   Pulse 73   Temp (!) 97.4 F (36.3 C)   Resp 18   LMP 07/02/2012   SpO2 100%   Physical Exam  ED Course/Procedures     Procedures  MDM   Assuming care of patient from Dr. Vanita Panda.   Patient in the ED for numbness, headaches, rectal bleed. Labs pending.   According to Dr. Vanita Panda, plan is to reassess the patient once lab results return, and decides if an MRI of the brain is needed.  Patient had no complains, no concerns from the nursing side. Will continue to monitor.   9:34 PM Patient was reassessed after her blood work looked normal.  Upon chart review it appeared that she had history of possible lesion in her brain.  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  I spoke with Dr. Mickeal Skinner, neuro oncology.  We discussed patient's presenting symptoms of left-sided intermittent numbness and headaches.  Also we discussed the MRI finding showing the new mass with possible hemorrhage.  Dr. Mickeal Skinner will follow up with the patient in the clinic.  He recommends that we start her on 4 mg oral Decadron.  The patient appears reasonably screened and/or stabilized for discharge and I doubt any other medical condition or other [facility] requiring further screening, evaluation, or treatment in the ED at this time prior to discharge.   Results from the ER workup discussed with the patient face to face and all questions answered to the best of my ability. The patient is safe for discharge with strict return precautions.    Varney Biles, MD 04/22/19 2135

## 2019-04-22 NOTE — ED Provider Notes (Signed)
[facility] DEPT Provider Note   CSN: [id number] Arrival date & time: 04/22/19  1336     History Chief Complaint  Patient presents with  . Headache  . Numbness    Rachel Bowen is a 54 y.o. female.  HPI   Patient presents with concern of numbness.  She also complains of headache.  Notably, patient was hospitalized earlier in the week at another [facility] due to rectal bleeding.  She did not receive transfusion, did receive multiple fluid boluses, was discharged with pantoprazole and iron.  She notes that during that hospitalization she had endoscopy, and has had colonoscopy within the past 3 months.  She reports a history of colitis as well as peptic ulcer disease. Over the past 2 days she has had persistent numbness, no weakness in the left upper and lower extremity.  She does describe some mild tingling, left no other sensory changes.  No facial changes, no speech difficulty, no confusion, disorientation, no syncope.  No true weakness no discoordination.  Past Medical History:  Diagnosis Date  . Gestational diabetes   . Hyperlipidemia   . Prediabetes     Patient Active Problem List   Diagnosis Date Noted  . Prediabetes 04/18/2017    Past Surgical History:  Procedure Laterality Date  . NO PAST SURGERIES       OB History   No obstetric history on file.     Family History  Problem Relation Age of Onset  . Lung cancer Mother   . Prostate cancer Father   . Colon cancer Maternal Grandfather   . Rectal cancer Neg Hx     Social History   Tobacco Use  . Smoking status: Former Smoker    Types: Cigarettes  . Smokeless tobacco: Never Used  Substance Use Topics  . Alcohol use: No  . Drug use: No    Home Medications Prior to Admission medications   Not on File    Allergies    Amoxicillin  Review of Systems   Review of Systems  Constitutional:       Per HPI, otherwise negative  HENT:       Per HPI, otherwise negative   Respiratory:       Per HPI, otherwise negative  Cardiovascular:       Per HPI, otherwise negative  Gastrointestinal: Negative for vomiting.  Endocrine:       Negative aside from HPI  Genitourinary:       Neg aside from HPI   Musculoskeletal:       Per HPI, otherwise negative  Skin: Negative.   Allergic/Immunologic: Negative for immunocompromised state.  Neurological: Positive for numbness and headaches. Negative for syncope.    Physical Exam Updated Vital Signs BP 101/68   Pulse 79   Temp (!) 97.4 F (36.3 C)   Resp 18   LMP 07/02/2012   SpO2 100%   Physical Exam Vitals and nursing note reviewed.  Constitutional:      General: She is not in acute distress.    Appearance: She is well-developed.  HENT:     Head: Normocephalic and atraumatic.  Eyes:     Conjunctiva/sclera: Conjunctivae normal.  Cardiovascular:     Rate and Rhythm: Normal rate and regular rhythm.  Pulmonary:     Effort: Pulmonary effort is normal. No respiratory distress.     Breath sounds: Normal breath sounds. No stridor.  Abdominal:     General: There is no distension.  Skin:  General: Skin is warm and dry.  Neurological:     Mental Status: She is alert and oriented to person, place, and time.     Cranial Nerves: No cranial nerve deficit.     Motor: No weakness, tremor, atrophy or abnormal muscle tone.     Coordination: Coordination normal.     ED Results / Procedures / Treatments   Labs (all labs ordered are listed, but only abnormal results are displayed) Labs Reviewed - No data to display  EKG EKG Interpretation  Date/Time:  Thursday April 22 2019 16:28:15 EST Ventricular Rate:  74 PR Interval:    QRS Duration: 78 QT Interval:  386 QTC Calculation: 429 R Axis:   42 Text Interpretation: Sinus rhythm Low voltage, precordial leads Baseline wander Abnormal ECG Confirmed by Carmin Muskrat [id number]) on 04/22/2019 4:47:16 PM   Radiology No results found.  Procedures Procedures  (including critical care time)  Medications Ordered in ED Medications - No data to display  ED Course  I have reviewed the triage vital signs and the nursing notes.  Pertinent labs & imaging results that were available during my care of the patient were reviewed by me and considered in my medical decision making (see chart for details).   5:00 PM This adult female presents with numbness on the left side, headache.  She is awake, alert, neurologically objectively unremarkable, but given her concern, as well as recent hospitalization, broad differential including anemia related paresthesia, blood loss, intracranial pathology all considered.  Initial exam, vitals, EKG all reassuring.  However, on signout the patient is awaiting results of CT scan, labs.  Dr. Kathrynn Humble is aware of the patient. Final Clinical Impression(s) / ED Diagnoses Final diagnoses:  Numbness     Carmin Muskrat, MD 04/22/19 1701

## 2019-04-22 NOTE — ED Triage Notes (Signed)
Patient reports tension headache with numbness and tingling to left side x2 days. Reports recent admission at [facility] for rectal bleeding.

## 2019-04-22 NOTE — ED Notes (Signed)
Pt transported to MRI at this time

## 2019-04-22 NOTE — Discharge Instructions (Addendum)
Expect a call from Dr. Mickeal Skinner tomorrow for appointment - call them if they dont reach you by 2 pm. Also, start taking the medicine prescribed.

## 2019-04-23 ENCOUNTER — Telehealth: Payer: Self-pay | Admitting: Internal Medicine

## 2019-04-23 ENCOUNTER — Telehealth: Payer: Self-pay | Admitting: *Deleted

## 2019-04-23 NOTE — Telephone Encounter (Signed)
Received call from pt stating that she was in ER last hs & had MRI & has a bleeding brain tumor & she wants to know what the next step is. Reviewed chart & pt has been started on decadron.  Pt says that h/a is better.  Discussed with Dr Mickeal Skinner & he was familiar with the pt & should see her next week.  Message sent to [facility], Seth Bake to schedule pt.  Reassured pt that Dr Mickeal Skinner knows about her & she should get a call regarding an appt soon.

## 2019-04-23 NOTE — Telephone Encounter (Signed)
I received a staff msg from Dr. Renda Rolls nurse to schedule an appt for next week. Rachel Bowen has been cld and scheduled to see Rachel Bowen on 3.8 at 930am. She's been made aware that she may bring one support person to the appt. Aware to arrive 15 minutes early.

## 2019-04-26 ENCOUNTER — Other Ambulatory Visit: Payer: Self-pay

## 2019-04-26 ENCOUNTER — Inpatient Hospital Stay: Payer: Self-pay | Attending: Internal Medicine | Admitting: Internal Medicine

## 2019-04-26 ENCOUNTER — Other Ambulatory Visit: Payer: Self-pay | Admitting: Radiation Therapy

## 2019-04-26 DIAGNOSIS — G9389 Other specified disorders of brain: Secondary | ICD-10-CM

## 2019-04-26 DIAGNOSIS — Z87891 Personal history of nicotine dependence: Secondary | ICD-10-CM

## 2019-04-26 DIAGNOSIS — R270 Ataxia, unspecified: Secondary | ICD-10-CM

## 2019-04-26 DIAGNOSIS — R519 Headache, unspecified: Secondary | ICD-10-CM

## 2019-04-26 DIAGNOSIS — R200 Anesthesia of skin: Secondary | ICD-10-CM

## 2019-04-26 NOTE — Progress Notes (Signed)
[facility] [address], [place] [address] [phone]   New Patient Evaluation  Date of Service: 04/26/19 Patient Name: Rachel Bowen Patient MRN: [id number] Patient DOB: 05-Mar-1965 Provider: Ventura Sellers, MD  Identifying Statement:  Rachel Bowen is a 54 y.o. 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    Referring Provider: Gildardo Pounds, NP [address],  [place] [address]  Oncologic History: 04/22/19: MRI demonstrates suprasellar mass, progressed from 2016 study  History of Present Illness: The patient's records from the referring physician were obtained and reviewed and the patient interviewed to confirm this HPI.  Rachel Bowen presented to medical attention last week after waking up and experiencing pressure in her head, dizzy feeling, and tingling numbness in her left arm.  These symptoms persisted and she underwent brain imaging in the ED.  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  She was started on 75m daily decadron x7 days, which she has almost completed.  Steroids have resolved all of her initial clinical symptoms and she feels at baseline currently.    Medications: Current Outpatient Medications on File Prior to Visit  Medication Sig Dispense Refill  . ferrous sulfate 325 (65 FE) MG tablet Take 325 mg by mouth daily.    . pantoprazole (PROTONIX) 40 MG tablet Take 40 mg by mouth in the morning and at bedtime. x14 days     No current facility-administered medications on file prior to visit.    Allergies:  Allergies  Allergen Reactions  . Amoxicillin Nausea And Vomiting    Reaction: unknown   Past Medical History:  Past Medical History:  Diagnosis Date  . Gestational diabetes   . Hyperlipidemia   . Prediabetes    Past Surgical History:  Past Surgical History:  Procedure Laterality Date  . NO PAST SURGERIES     Social  History:  Social History   Socioeconomic History  . Marital status: Single    Spouse name: Not on file  . Number of children: 1  . Years of education: Not on file  . Highest education level: Not on file  Occupational History  . Occupation: unemployed  Tobacco Use  . Smoking status: Former Smoker    Types: Cigarettes  . Smokeless tobacco: Never Used  Substance and Sexual Activity  . Alcohol use: No  . Drug use: No  . Sexual activity: Not Currently    Birth control/protection: None  Other Topics Concern  . Not on file  Social History Narrative  . Not on file   Social Determinants of Health   Financial Resource Strain:   . Difficulty of Paying Living Expenses: Not on file  Food Insecurity:   . Worried About RCharity fundraiserin the Last Year: Not on file  . Ran Out of Food in the Last Year: Not on file  Transportation Needs:   . Lack of Transportation (Medical): Not on file  . Lack of Transportation (Non-Medical): Not on file  Physical Activity:   . Days of Exercise per Week: Not on file  . Minutes of Exercise per Session: Not on file  Stress:   . Feeling of Stress : Not on file  Social Connections:   . Frequency of Communication with Friends and Family: Not on file  . Frequency of Social Gatherings with Friends and Family: Not on file  . Attends Religious Services: Not on file  . Active  Member of Clubs or Organizations: Not on file  . Attends Archivist Meetings: Not on file  . Marital Status: Not on file  Intimate Partner Violence:   . Fear of Current or Ex-Partner: Not on file  . Emotionally Abused: Not on file  . Physically Abused: Not on file  . Sexually Abused: Not on file   Family History:  Family History  Problem Relation Age of Onset  . Lung cancer Mother   . Prostate cancer Father   . Colon cancer Maternal Grandfather   . Rectal cancer Neg Hx     Review of Systems: Constitutional: Doesn't report fevers, chills or abnormal weight  loss Eyes: Doesn't report blurriness of vision Ears, nose, mouth, throat, and face: Doesn't report sore throat Respiratory: Doesn't report cough, dyspnea or wheezes Cardiovascular: Doesn't report palpitation, chest discomfort  Gastrointestinal:  Doesn't report nausea, constipation, diarrhea GU: Doesn't report incontinence Skin: Doesn't report skin rashes Neurological: Per HPI Musculoskeletal: Doesn't report joint pain Behavioral/Psych: Doesn't report anxiety  Physical Exam: Vitals:   04/26/19 0939  BP: 117/69  Pulse: 73  Resp: 18  Temp: 98.2 F (36.8 C)  SpO2: 100%   KPS: 90. General: Alert, cooperative, pleasant, in no acute distress Head: Normal EENT: No conjunctival injection or scleral icterus.  Lungs: Resp effort normal Cardiac: Regular rate Abdomen: Non-distended abdomen Skin: No rashes cyanosis or petechiae. Extremities: No clubbing or edema  Neurologic Exam: Mental Status: Awake, alert, attentive to examiner. Oriented to self and environment. Language is fluent with intact comprehension.  Cranial Nerves: Visual acuity is grossly normal. Visual fields are full. Extra-ocular movements intact. No ptosis. Face is symmetric Motor: Tone and bulk are normal. Power is full in both arms and legs. Reflexes are symmetric, no pathologic reflexes present.  Sensory: Intact to light touch Gait: Normal.   Labs: I have reviewed the data as listed    Component Value Date/Time   NA 140 04/22/2019 1624   NA 143 12/11/2018 1623   K 3.8 04/22/2019 1624   CL 107 04/22/2019 1624   CO2 24 04/22/2019 1624   GLUCOSE 89 04/22/2019 1624   BUN 17 04/22/2019 1624   BUN 13 12/11/2018 1623   CREATININE 0.82 04/22/2019 1624   CALCIUM 9.0 04/22/2019 1624   PROT 7.1 04/22/2019 1624   PROT 7.0 12/11/2018 1623   ALBUMIN 3.9 04/22/2019 1624   ALBUMIN 4.3 12/11/2018 1623   AST 25 04/22/2019 1624   ALT 25 04/22/2019 1624   ALKPHOS 58 04/22/2019 1624   BILITOT 0.1 (L) 04/22/2019 1624    BILITOT <0.2 12/11/2018 1623   GFRNONAA >60 04/22/2019 1624   GFRAA >60 04/22/2019 1624   Lab Results  Component Value Date   WBC 7.3 04/22/2019   NEUTROABS 4.5 04/22/2019   HGB 9.8 (L) 04/22/2019   HCT 31.2 (L) 04/22/2019   MCV 92.6 04/22/2019   PLT 232 04/22/2019    Imaging:  CT Head Wo Contrast  Result Date: 04/22/2019 CLINICAL DATA:  54 year old female with headache, numbness, ataxia. EXAM: CT HEAD WITHOUT CONTRAST TECHNIQUE: Contiguous axial images were obtained from the base of the skull through the vertex without intravenous contrast. COMPARISON:  Brain MRI 09/21/2014. FINDINGS: Brain: Increased masslike soft tissue enlargement at the hypothalamus and probably contiguous with the pulmonary infundibulum and chiasm which were abnormal on the 2016 MRI. Currently this measures about 13 mm diameter (previously approximately 6 mm on MRI). There is also partial calcification of the lesion as seen on coronal image 28.  No regional cerebral edema. No other intracranial mass lesion is evident. No significant intracranial mass effect. No ventriculomegaly. Gray-white matter differentiation is within normal limits throughout the brain. No acute intracranial hemorrhage identified. No cortically based acute infarct identified. Vascular: No suspicious intracranial vascular hyperdensity. Skull: Negative. Sinuses/Orbits: Visualized paranasal sinuses and mastoids are clear. Other: Visualized orbits and scalp soft tissues are within normal limits. IMPRESSION: 1. Slowly enlarging hypothalamic region soft tissue mass, which appears roughly doubled in size since a 2016 MRI. The lesion is partially calcified. Recommend repeat MRI Head without and with contrast (pituitary protocol preferably, although addition of thin coronal T2 weighted imaging may also be helpful). Top differential considerations include craniopharyngioma, hypothalamic hamartoma, low-grade glioma, or perhaps inflammatory etiology such as Langerhans  cell histiocytosis, neurosarcoidosis. 2. No other acute intracranial abnormality. Electronically Signed   By: Genevie Ann M.D.   On: 04/22/2019 16:17   MR BRAIN W WO CONTRAST  Result Date: 04/22/2019 CLINICAL DATA:  Headache and left-sided numbness. EXAM: MRI HEAD WITHOUT AND WITH CONTRAST TECHNIQUE: Multiplanar, multiecho pulse sequences of the brain and surrounding structures were obtained without and with intravenous contrast. CONTRAST:  66m GADAVIST GADOBUTROL 1 MMOL/ML IV SOLN COMPARISON:  Head CT 04/22/2019 and MRI 10/01/2014 FINDINGS: The study is mildly motion degraded. Brain: There is a 1.2 x 1.1 x 1.2 cm hypothalamic region mass which has substantially enlarged from the 2016 MRI and was shown to be partially calcified on today's CT. The mass is contiguous with the superior aspect of the pituitary infundibulum and is predominantly T1 hyperintense with susceptibility artifact consistent with blood products. Abnormal T1 hyperintensity also extends inferiorly throughout the infundibulum and into the posterior aspect of the pituitary gland in the same distribution where there was nonenhancing soft tissue on the prior MRI, however the involvement of the posterior pituitary gland is greater now. No appreciable enhancement of the mass is identified, either in the suprasellar or intrasellar components although intrinsic T1 hyperintensity limits assessment. The mass may involve the optic chiasm, and there is chronic thickening of the prechiasmatic optic nerves. The remainder of the pituitary gland enhances normally. No acute infarct, midline shift, or extra-axial fluid collection is present. Scattered punctate foci of T2 hyperintensity in the cerebral white matter bilaterally have slightly increased in number from the prior MRI and are minimally prominent for age. The ventricles and sulci are normal. Vascular: Major intracranial vascular flow voids are preserved. Skull and upper cervical spine: Unremarkable bone  marrow signal. Sinuses/Orbits: Unremarkable orbits. Paranasal sinuses and mastoid air cells are clear. Other: None. IMPRESSION: 1. 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 The mass has enlarged from 2016 and now appears hemorrhagic without appreciable enhancement. The appearance remains nonspecific, however the enlargement is concerning for a neoplasm such as glioma (including granular cell tumor and pituicytoma) and craniopharyngioma. An inflammatory or granulomatous process is considered less likely. 2. Minimal cerebral white matter T2 signal changes, nonspecific though may reflect chronic small vessel ischemia, migraines, or prior infection/inflammation. Electronically Signed   By: ALogan BoresM.D.   On: 04/22/2019 20:39     Assessment/Plan Suprasellar Mass  We [facility]  She presents with clinical and radiographic syndrome c/w suprasellar mass.  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    We strongly recommended surgical debulking or complete resection if technically feasible.  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  We discussed possibility of endocrine insufficiency resulting from surgery and/or subsequent radiosurgery if warranted.  Referral will be placed for consultation with Dr. Zada Finders of [facility].  Patient is agreeable to this plan.  Decadron will expire in several days, she should call us if symptoms recur.    Screening for potential clinical trials was performed and discussed using eligibility criteria for active protocols at [facility], loco-regional tertiary centers, as well as national database available on [email or web address].com.    The patient is not a candidate for a research protocol at this time due to no suitable study  identified.   We spent twenty additional minutes teaching regarding the natural history, biology, and historical experience in the treatment of brain tumors. We then discussed in detail the current recommendations for therapy focusing on the mode of administration, mechanism of action, anticipated toxicities, and quality of life issues associated with this plan. We also provided teaching sheets for the patient to take home as an additional resource.  All questions were answered. The patient knows to call the clinic with any problems, questions or concerns. No barriers to learning were detected.  The total time spent in the encounter was 45 minutes and more than 50% was on counseling and review of test results   Ventura Sellers, MD Medical Director of Neuro-Oncology [facility] 04/26/19 9:42 AM

## 2019-04-27 ENCOUNTER — Telehealth: Payer: Self-pay | Admitting: Radiation Therapy

## 2019-04-27 NOTE — Telephone Encounter (Signed)
Spoke with Ms. Corwin about her upcoming consult with Dr. Emelda Brothers on 3/12, arrive at 11:00am. I also sent her an e-mail with information about Dr. Zada Finders and their office location. Rachel Bowen was happy for the call and thankful for the quick appointment. She has the information saved in her phone and plans to be there.   Mont Dutton R.T.(R)(T) Radiation Special Procedures Navigator

## 2019-05-12 ENCOUNTER — Emergency Department (HOSPITAL_COMMUNITY)
Admission: EM | Admit: 2019-05-12 | Discharge: 2019-05-13 | Disposition: A | Discharge status: Home / Self Care | Payer: Self-pay | Attending: Emergency Medicine | Admitting: Emergency Medicine

## 2019-05-12 ENCOUNTER — Other Ambulatory Visit: Payer: Self-pay

## 2019-05-12 ENCOUNTER — Encounter (HOSPITAL_COMMUNITY): Payer: Self-pay

## 2019-05-12 ENCOUNTER — Telehealth: Payer: Self-pay | Admitting: Emergency Medicine

## 2019-05-12 ENCOUNTER — Telehealth: Payer: Self-pay

## 2019-05-12 DIAGNOSIS — Z87891 Personal history of nicotine dependence: Secondary | ICD-10-CM | POA: Insufficient documentation

## 2019-05-12 DIAGNOSIS — G9389 Other specified disorders of brain: Secondary | ICD-10-CM

## 2019-05-12 DIAGNOSIS — H539 Unspecified visual disturbance: Secondary | ICD-10-CM

## 2019-05-12 LAB — CBC
HCT: 39.3 % (ref 36.0–46.0)
Hemoglobin: 12 g/dL (ref 12.0–15.0)
MCH: 28.8 pg (ref 26.0–34.0)
MCHC: 30.5 g/dL (ref 30.0–36.0)
MCV: 94.2 fL (ref 80.0–100.0)
Platelets: 217 10*3/uL (ref 150–400)
RBC: 4.17 MIL/uL (ref 3.87–5.11)
RDW: 14.8 % (ref 11.5–15.5)
WBC: 5.9 10*3/uL (ref 4.0–10.5)
nRBC: 0 % (ref 0.0–0.2)

## 2019-05-12 LAB — BASIC METABOLIC PANEL
Anion gap: 10 (ref 5–15)
BUN: 11 mg/dL (ref 6–20)
CO2: 26 mmol/L (ref 22–32)
Calcium: 9.4 mg/dL (ref 8.9–10.3)
Chloride: 106 mmol/L (ref 98–111)
Creatinine, Ser: 0.89 mg/dL (ref 0.44–1.00)
GFR calc Af Amer: >60 mL/min (ref >60)
GFR calc non Af Amer: >60 mL/min (ref >60)
Glucose, Bld: 118 mg/dL — ABNORMAL HIGH (ref 70–99)
Potassium: 3.9 mmol/L (ref 3.5–5.1)
Sodium: 142 mmol/L (ref 135–145)

## 2019-05-12 NOTE — ED Triage Notes (Signed)
Pt reports a couple days of left eye blurred vision and headache. 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 Neurologist recommended pt come to ER for further evaluation. Pt arrives alert, oriented, ambulatory.

## 2019-05-12 NOTE — Telephone Encounter (Signed)
Pt called [facility] triage line reporting worsening blurred vision and increased pressure in her head w/severe headache for two days.  Pt has been waiting for referral/to have appt scheduled for surgical intervention for two weeks but states she hasn't had it created it d/t some issues with insurance w/ the plastic surgeon's office.  Pt has baseline bleeding per MD Vaslow/last MRI behind eyes (no external bleeding) but reports that the symptoms of it have severely worsened over last few days.  MD Vaslow not in office today at [place].  Spoke with PA Lucianne Lei [facility] provider) who advised pt to go to [facility] ER for evaluation.  Pt states she feels able to drive at this time and is alert/oriented during phone call.  Pt verbalized understanding to go to [facility] ER at this time and that MD Vaslow will be notified.

## 2019-05-12 NOTE — Telephone Encounter (Signed)
Returned patients phone call in regard to referral that Dr Mickeal Skinner sent to a surgeon and her never hearing from them. When I called patient she stated that she just talk to nurse Learta Codding and PA Lucianne Lei and was advised to go to ER  Now for increased symptoms that she was having. Will make Dr Reche Dixon nurse tomorrow aware of this situation.

## 2019-05-13 ENCOUNTER — Emergency Department (HOSPITAL_COMMUNITY): Payer: Self-pay

## 2019-05-13 MED ORDER — DIPHENHYDRAMINE HCL 50 MG/ML IJ SOLN
25.0000 mg | Freq: Once | INTRAMUSCULAR | Status: AC
  Administered 2019-05-13: 25 mg via INTRAVENOUS
  Filled 2019-05-13: qty 1

## 2019-05-13 MED ORDER — DEXAMETHASONE SODIUM PHOSPHATE 10 MG/ML IJ SOLN
10.0000 mg | Freq: Once | INTRAMUSCULAR | Status: AC
  Administered 2019-05-13: 10 mg via INTRAVENOUS
  Filled 2019-05-13: qty 1

## 2019-05-13 MED ORDER — METOCLOPRAMIDE HCL 5 MG/ML IJ SOLN
10.0000 mg | Freq: Once | INTRAMUSCULAR | Status: AC
  Administered 2019-05-13: 10 mg via INTRAVENOUS
  Filled 2019-05-13: qty 2

## 2019-05-13 MED ORDER — DEXAMETHASONE 4 MG PO TABS: 4.0000 mg | ORAL_TABLET | Freq: Every day | ORAL | 0 refills | Status: DC

## 2019-05-13 MED ORDER — GADOBUTROL 1 MMOL/ML IV SOLN
7.5000 mL | Freq: Once | INTRAVENOUS | Status: AC | PRN
  Administered 2019-05-13: 7.5 mL via INTRAVENOUS

## 2019-05-13 NOTE — ED Provider Notes (Signed)
[facility] EMERGENCY DEPARTMENT Provider Note   CSN: [id number] Arrival date & time: 05/12/19  1651     History Chief Complaint  Patient presents with  . Blurred Vision  . Headache    Rachel Bowen is a 54 y.o. female.  HPI     This is a 54 year old female with a history of hyperlipidemia and recent diagnosis of a suprasellar/patellar mass who presents with worsening headache and vision changes.  Patient reports over the last 2 to 3 days she has had a worsening sharp shooting headache over the left side of her face and head.  Currently her headache is 5 out of 10.  However, she states upon arrival it was 10 out of 10.  She has not taken anything for her headache.  She also reports worsening blurry vision in the left eye.  She states that she generally just feels like she cannot see as well out of that eye.  No visual field cuts.  Patient reports that she has persistent numbness and generalized weakness in the bilateral lower extremities.  No recent fevers.  She has seen Dr. Mickeal Skinner with oncology who recommended surgical debulking if feasible.  Patient reports that she did see Dr. Zada Finders who recommended that she see a plastic surgeon who she has been unable to get in with.  Past Medical History:  Diagnosis Date  . Gestational diabetes   . Hyperlipidemia   . Prediabetes     Patient Active Problem List   Diagnosis Date Noted  . Brain mass 04/26/2019  . Prediabetes 04/18/2017    Past Surgical History:  Procedure Laterality Date  . NO PAST SURGERIES       OB History   No obstetric history on file.     Family History  Problem Relation Age of Onset  . Lung cancer Mother   . Prostate cancer Father   . Colon cancer Maternal Grandfather   . Rectal cancer Neg Hx     Social History   Tobacco Use  . Smoking status: Former Smoker    Types: Cigarettes  . Smokeless tobacco: Never Used  Substance Use Topics  . Alcohol use: No  . Drug use: No     Home Medications Prior to Admission medications   Medication Sig Start Date End Date Taking? Authorizing Provider  dexamethasone (DECADRON) 4 MG tablet Take 1 tablet (4 mg total) by mouth daily. 05/13/19   Kerryn Tennant, Barbette Hair, MD  ferrous sulfate 325 (65 FE) MG tablet Take 325 mg by mouth daily. 04/20/19 05/20/19  [provider]  pantoprazole (PROTONIX) 40 MG tablet Take 40 mg by mouth in the morning and at bedtime. x14 days 04/18/19 05/02/19  [provider]    Allergies    Amoxicillin  Review of Systems   Review of Systems  Constitutional: Negative for fever.  Eyes: Positive for visual disturbance.  Respiratory: Negative for shortness of breath.   Cardiovascular: Negative for chest pain.  Gastrointestinal: Negative for abdominal pain, nausea and vomiting.  Genitourinary: Negative for dysuria.  Neurological: Positive for numbness and headaches. Negative for speech difficulty and weakness.  All other systems reviewed and are negative.   Physical Exam Updated Vital Signs BP (!) 142/95 (BP Location: Right Arm)   Pulse 99   Temp 98.4 F (36.9 C) (Oral)   Resp 16   Ht 1.575 m (5' 2" )   Wt 75.3 kg   LMP 07/02/2012   SpO2 100%   BMI 30.36 kg/m  Physical Exam Vitals and nursing note reviewed.  Constitutional:      Appearance: She is well-developed.  HENT:     Head: Normocephalic and atraumatic.  Eyes:     Extraocular Movements: Extraocular movements intact.     Pupils: Pupils are equal, round, and reactive to light.     Comments: Visual fields appear intact  Cardiovascular:     Rate and Rhythm: Normal rate and regular rhythm.     Heart sounds: Normal heart sounds.  Pulmonary:     Effort: Pulmonary effort is normal. No respiratory distress.     Breath sounds: No wheezing.  Abdominal:     General: Bowel sounds are normal.     Palpations: Abdomen is soft.  Musculoskeletal:     Cervical back: Neck supple.  Skin:    General: Skin is warm and dry.   Neurological:     Mental Status: She is alert and oriented to person, place, and time.     Comments: Cranial nerves II through XII intact, 5 out of 5 strength in all 4 extremities, no dysmetria to finger-nose-finger  Psychiatric:        Mood and Affect: Mood normal.     ED Results / Procedures / Treatments   Labs (all labs ordered are listed, but only abnormal results are displayed) Labs Reviewed  BASIC METABOLIC PANEL - Abnormal; Notable for the following components:      Result Value   Glucose, Bld 118 (*)    All other components within normal limits  CBC    EKG None  Radiology MR Brain W and Wo Contrast  Result Date: 05/13/2019 CLINICAL DATA:  Couple of days of left eye blurred vision and headache. History of pituitary adenoma and apical flexi EXAM: MRI HEAD WITHOUT AND WITH CONTRAST TECHNIQUE: Multiplanar, multiecho pulse sequences of the brain and surrounding structures were obtained without and with intravenous contrast. CONTRAST:  7.60m GADAVIST GADOBUTROL 1 MMOL/ML IV SOLN COMPARISON:  Twenty-one days ago FINDINGS: Brain: Standard brain protocol (not pituitary) was performed. T1 hyperintense mass at the infundibulum and hypothalamic region with hypointense gradient signal and T2 hypointense nodule, the latter at least correlating with calcification by CT. The mass at the level of the hypothalamus measures up to 12 mm anterior to posterior, unchanged. There is continuity with the posterior sella where there is T1 hyperintensity greater than expected for a normal neurohypophysis. The mass was present in 2016 and appeared nonenhancing centrally at that time. Enhancement detection today is limited by intrinsic T1 hyperintensity. Other than calcifications the mass is not hyperdense on CT. There is mass effect on the optic chiasm which is upwardly displaced. No hydrocephalus, infarct, or atrophy. Vascular: Normal flow voids and vascular enhancements Skull and upper cervical spine: Normal  marrow signal Sinuses/Orbits: Negative.  The septum is essentially midline IMPRESSION: Compared to 3 weeks ago the mass centered at the hypothalamus/suprasellar cistern is unchanged in size at up to 12 mm. 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 Concerning the patient's visual complaint there is continued compression of the optic chiasm and hypothalamus. Electronically Signed   By: JMonte FantasiaM.D.   On: 05/13/2019 05:43    Procedures Procedures (including critical care time)  Medications Ordered in ED Medications  diphenhydrAMINE (BENADRYL) injection 25 mg (25 mg Intravenous Given 05/13/19 0353)  metoCLOPramide (REGLAN) injection 10 mg (10 mg Intravenous Given 05/13/19 0353)  dexamethasone (DECADRON) injection 10 mg (10 mg Intravenous Given 05/13/19  1017)  gadobutrol (GADAVIST) 1 MMOL/ML injection 7.5 mL (7.5 mLs Intravenous Contrast Given 05/13/19 0507)    ED Course  I have reviewed the triage vital signs and the nursing notes.  Pertinent labs & imaging results that were available during my care of the patient were reviewed by me and considered in my medical decision making (see chart for details).    MDM Rules/Calculators/A&P                       Patient presents with worsening headache and vision changes.  Known suprasellar mass.  She is followed up with oncology and neurosurgery but has yet to get into see oculoplastics.  She is overall nontoxic and vital signs are reassuring.  Objectively, no significant findings on neuro exam.  Patient was given migraine cocktail and 10 mg of IV Decadron.  She rested comfortably.  Repeat MRI shows stable mass which continues to press on her optic chiasm.  Touch base with neurosurgery PA and decision made to reinitiate Decadron as an outpatient.  I have messaged both Dr. Mickeal Skinner and Dr. Zada Finders for patient follow-up and patient was given strict return  precautions.  After history, exam, and medical workup I feel the patient has been appropriately medically screened and is safe for discharge home. Pertinent diagnoses were discussed with the patient. Patient was given return precautions.   Final Clinical Impression(s) / ED Diagnoses Final diagnoses:  Suprasellar mass  Vision changes    Rx / DC Orders ED Discharge Orders         Ordered    dexamethasone (DECADRON) 4 MG tablet  Daily     05/13/19 0644           Merryl Hacker, MD 05/13/19 [id number]

## 2019-05-13 NOTE — Discharge Instructions (Addendum)
You were seen today for worsening headache and vision changes.  Your mass is stable on the MRI.  Follow-up closely with Dr. Venetia Constable and Dr. Mickeal Skinner.  Start Decadron tomorrow.  If you have any new or worsening symptoms, you should be reevaluated.

## 2019-05-14 ENCOUNTER — Telehealth: Payer: Self-pay | Admitting: *Deleted

## 2019-05-14 NOTE — Telephone Encounter (Signed)
Patient called to report she visited the Emergency Room due to blurred vision and headache.  She is still waiting on plastic surgery appt to be set up.  Dr. Colleen Can office is trying to get her set up with the plastic surgery and has told her to expect a call from them with appt details.  While waiting she developed headache and blurred vision which brought her to the ER.    They prescribed a 5 day course of Decadron 4 mg daily.  She states that it has improved with steroid.    Will let Dr. Mickeal Skinner know so that if he needs to make adjustments or further recommendations I can advise the patient.  Routed.

## 2019-05-18 ENCOUNTER — Inpatient Hospital Stay (HOSPITAL_BASED_OUTPATIENT_CLINIC_OR_DEPARTMENT_OTHER): Payer: Self-pay | Admitting: Internal Medicine

## 2019-05-18 DIAGNOSIS — G9389 Other specified disorders of brain: Secondary | ICD-10-CM

## 2019-05-18 MED ORDER — DEXAMETHASONE 2 MG PO TABS: 2.0000 mg | ORAL_TABLET | Freq: Every day | ORAL | 0 refills | Status: DC

## 2019-05-18 NOTE — Progress Notes (Signed)
I connected with Rachel Bowen on 05/18/19 at  2:30 PM EDT by telephone visit and verified that I am speaking with the correct person using two identifiers.  I discussed the limitations, risks, security and privacy concerns of performing an evaluation and management service by telemedicine and the availability of in-person appointments. I also discussed with the patient that there may be a patient responsible charge related to this service. The patient expressed understanding and agreed to proceed.  Other persons participating in the visit and their role in the encounter:  n/a   Patient's location:  Home  Provider's location:  Office  Chief Complaint:  Headache, blurry vision, brain mass  History of Present Ilness: Rachel Bowen present today for follow up after ED visit for severe headache associated with blurry vision.  Symptoms abated and she was discharged with 5 days course of dexamethasone 764m daily.  She feels improved, although continues to complain of worsening of "general" vision, increased fatigue, weakness of legs and whole body. She is working on obtaining financial coverage for craniotomy with Dr. OZada Finders   Observations: Language and cognition normal Assessment and Plan:  -Reduce decadron to 29mdaily x5 days, then 64m25maily x5 days, then stop. -Referral to ophthalmology -For systemic complaints and sellar mass, check serum TSH, prolactin, cortisol, IGF-1, LF, FSH -Consultation with social work for financial assistance concerns surrounding planned surgery    Follow Up Instructions: Follow up via phone or in person following testing and surgery  I discussed the assessment and treatment plan with the patient.  The patient was provided an opportunity to ask questions and all were answered.  The patient agreed with the plan and demonstrated understanding of the instructions.    The patient was advised to call back or seek an in-person evaluation if the symptoms worsen or if the  condition fails to improve as anticipated.  I provided 5-10 minutes of non-face-to-face time during this enocunter.  ZacVentura SellersD   I provided 20 minutes of non face-to-face telephone visit time during this encounter, and > 50% was spent counseling as documented under my assessment & plan.

## 2019-05-19 ENCOUNTER — Encounter: Payer: Self-pay | Admitting: Nurse Practitioner

## 2019-05-19 ENCOUNTER — Encounter: Payer: Self-pay | Admitting: Internal Medicine

## 2019-05-19 ENCOUNTER — Telehealth: Payer: Self-pay | Admitting: *Deleted

## 2019-05-19 ENCOUNTER — Ambulatory Visit: Payer: Medicaid Other | Admitting: Nurse Practitioner

## 2019-05-19 NOTE — Telephone Encounter (Signed)
Faxed referral to Dr. Katy Fitch Ophthalmologist.

## 2019-05-19 NOTE — Telephone Encounter (Signed)
Patient will come to [facility] and get her labs drawn for [facility].

## 2019-05-20 ENCOUNTER — Inpatient Hospital Stay: Payer: Self-pay | Attending: Internal Medicine

## 2019-05-20 ENCOUNTER — Other Ambulatory Visit: Payer: Self-pay

## 2019-05-20 ENCOUNTER — Inpatient Hospital Stay: Payer: Self-pay | Admitting: *Deleted

## 2019-05-20 DIAGNOSIS — G9389 Other specified disorders of brain: Secondary | ICD-10-CM | POA: Insufficient documentation

## 2019-05-20 LAB — CORTISOL: Cortisol, Plasma: 0.4 ug/dL

## 2019-05-20 NOTE — Progress Notes (Signed)
[place] Work  Clinical Social Work was referred by neuro-oncologist for assessment of psychosocial needs; specifically difficulty obtaining payment for surgery.  Clinical Social Worker met with patient in [place] office to offer support and assess for needs.  Ms. Topping explained her situation in detail, specifically concerns regarding payment to non-[facility] facilities- she reported facilities are requiring payment up front prior to medically necessary craniotomy.  CSW contacted [facility] and Dr. Letitia Caul Rubenstein's office while patient in [place] office in attempts to advocate for patient.  Ms. Manganelli has applied for Medicaid and is awaiting a decision, but expressed importance of having surgery now.  CSW referred patient to [facility] for disability assistance.  CSW will explore other financial resources that may assist patient in paying medical bills and other needs.  CSW will follow up with patient once obtaining more information.    Gwinda Maine, LCSW  Clinical Social Worker [facility]

## 2019-05-21 DIAGNOSIS — D444 Neoplasm of uncertain behavior of craniopharyngeal duct: Secondary | ICD-10-CM | POA: Insufficient documentation

## 2019-05-21 LAB — LUTEINIZING HORMONE: LH: 43.6 m[IU]/mL

## 2019-05-21 LAB — FOLLICLE STIMULATING HORMONE: FSH: 75.1 m[IU]/mL

## 2019-05-24 ENCOUNTER — Encounter: Payer: Self-pay | Admitting: Nurse Practitioner

## 2019-05-25 ENCOUNTER — Other Ambulatory Visit: Payer: Self-pay | Admitting: *Deleted

## 2019-05-25 DIAGNOSIS — G9389 Other specified disorders of brain: Secondary | ICD-10-CM

## 2019-05-25 NOTE — Progress Notes (Signed)
Patient called to request phone visit with Dr. Mickeal Skinner to discuss recent labs drawn.  Upon chart review see that not all of the labs were drawn that were ordered because some were scheduled with anticipated date of 10/18/2019.  Those will be done at that date unless orders are changed.  She reports that she saw the plastic surgeon and they do not have privileges at [facility] to do the surgery. She thought Dr. Zada Finders would have been aware of that prior to sending her there since she is paying out of pocket for everything at this point.  Patient is asking for recommendations from Dr. Mickeal Skinner at [facility] or somewhere else.   Routed to Dr. Mickeal Skinner to advise how to proceed.

## 2019-05-25 NOTE — Addendum Note (Signed)
Addended by: Ventura Sellers on: 05/25/2019 02:22 PM   Modules accepted: Orders

## 2019-05-26 ENCOUNTER — Telehealth: Payer: Self-pay | Admitting: *Deleted

## 2019-05-26 LAB — HM DIABETES EYE EXAM

## 2019-05-26 NOTE — Telephone Encounter (Signed)
Spoke with patient, relayed results for labs and advised about follow up in 4 months if she doesn't have surgery before then.  Advised her to reach out to Neurosurgeon or Psychiatric nurse for referral to [facility] or [facility] to get referral for someone who has in house privileges.  She stated understanding.  She also advised she is down to decadron 1 mg daily and has 3 more days to take it.  She will advise if the pressure in the head returns and if she is unable to come off of the steroid.

## 2019-05-30 ENCOUNTER — Other Ambulatory Visit: Payer: Self-pay | Admitting: Internal Medicine

## 2019-05-30 ENCOUNTER — Encounter: Payer: Self-pay | Admitting: Internal Medicine

## 2019-05-31 ENCOUNTER — Other Ambulatory Visit: Payer: Self-pay | Admitting: Internal Medicine

## 2019-05-31 ENCOUNTER — Telehealth: Payer: Self-pay | Admitting: *Deleted

## 2019-05-31 MED ORDER — DEXAMETHASONE 2 MG PO TABS: 2.0000 mg | ORAL_TABLET | Freq: Every day | ORAL | 0 refills | Status: AC

## 2019-05-31 NOTE — Telephone Encounter (Signed)
Patient called to report that she attempted to wean off of Decadron as instructed.  States when she dropped to Decadron 1 mg she had difficulty walking.  Per Dr. Mickeal Skinner approved refill of Decadron 2 mg   Patient made aware.

## 2019-06-23 ENCOUNTER — Ambulatory Visit: Payer: 59

## 2019-06-23 ENCOUNTER — Other Ambulatory Visit: Payer: Self-pay

## 2019-08-31 LAB — HM DIABETES EYE EXAM

## 2019-09-10 DIAGNOSIS — R7989 Other specified abnormal findings of blood chemistry: Secondary | ICD-10-CM | POA: Insufficient documentation

## 2019-09-10 DIAGNOSIS — E559 Vitamin D deficiency, unspecified: Secondary | ICD-10-CM | POA: Insufficient documentation

## 2019-09-10 DIAGNOSIS — E785 Hyperlipidemia, unspecified: Secondary | ICD-10-CM | POA: Insufficient documentation

## 2019-09-10 DIAGNOSIS — K219 Gastro-esophageal reflux disease without esophagitis: Secondary | ICD-10-CM | POA: Insufficient documentation

## 2019-09-10 DIAGNOSIS — E669 Obesity, unspecified: Secondary | ICD-10-CM | POA: Insufficient documentation

## 2019-10-22 DIAGNOSIS — D497 Neoplasm of unspecified behavior of endocrine glands and other parts of nervous system: Secondary | ICD-10-CM | POA: Insufficient documentation

## 2020-04-24 ENCOUNTER — Other Ambulatory Visit: Payer: Self-pay | Admitting: Physician Assistant

## 2020-04-24 ENCOUNTER — Other Ambulatory Visit (HOSPITAL_COMMUNITY)
Admission: RE | Admit: 2020-04-24 | Discharge: 2020-04-24 | Disposition: A | Discharge status: Home / Self Care | Payer: 59 | Source: Ambulatory Visit | Attending: Physician Assistant | Admitting: Physician Assistant

## 2020-04-24 DIAGNOSIS — Z124 Encounter for screening for malignant neoplasm of cervix: Secondary | ICD-10-CM | POA: Insufficient documentation

## 2020-04-27 LAB — CYTOLOGY - PAP
Comment: NEGATIVE
Diagnosis: NEGATIVE
High risk HPV: NEGATIVE

## 2020-05-02 ENCOUNTER — Other Ambulatory Visit: Payer: Self-pay | Admitting: Nurse Practitioner

## 2020-05-02 ENCOUNTER — Other Ambulatory Visit: Payer: Self-pay | Admitting: Physician Assistant

## 2020-05-02 DIAGNOSIS — Z1231 Encounter for screening mammogram for malignant neoplasm of breast: Secondary | ICD-10-CM

## 2020-05-05 ENCOUNTER — Other Ambulatory Visit: Payer: Self-pay

## 2020-05-05 ENCOUNTER — Ambulatory Visit
Admission: RE | Admit: 2020-05-05 | Discharge: 2020-05-05 | Disposition: A | Discharge status: Home / Self Care | Payer: 59 | Source: Ambulatory Visit | Attending: Physician Assistant | Admitting: Physician Assistant

## 2020-05-05 DIAGNOSIS — Z1231 Encounter for screening mammogram for malignant neoplasm of breast: Secondary | ICD-10-CM

## 2020-05-27 ENCOUNTER — Other Ambulatory Visit: Payer: Self-pay

## 2020-05-27 ENCOUNTER — Ambulatory Visit
Admission: EM | Admit: 2020-05-27 | Discharge: 2020-05-27 | Disposition: A | Discharge status: Home / Self Care | Payer: 59 | Attending: Family Medicine | Admitting: Family Medicine

## 2020-05-27 DIAGNOSIS — S61412A Laceration without foreign body of left hand, initial encounter: Secondary | ICD-10-CM | POA: Diagnosis not present

## 2020-05-27 NOTE — ED Triage Notes (Signed)
Pt presents with left hand laceration from kitchen knife about an hour ago

## 2020-05-27 NOTE — ED Provider Notes (Signed)
  [facility]   [id number] 05/27/20 Arrival Time: 8937  ASSESSMENT & PLAN:  1. Laceration of left hand, foreign body presence unspecified, initial encounter     Procedure: Verbal consent obtained. Patient provided with risks and alternatives to the procedure. Wound copiously irrigated with NS then cleansed with betadine. Local anesthesia: Lidocaine 1% with epinephrine. Wound carefully explored. No foreign body, tendon injury, or nonviable tissue were noted. Using sterile technique, 5 interrupted 4-0 Prolene sutures were placed to reapproximate the wound. Procedure tolerated well. No complications. Minimal bleeding. Advised to look for and return for any signs of infection such as redness, swelling, discharge, or worsening pain. Return for suture removal in 7 days.   Follow-up Information    [facility] at [facility] .   Specialty: Urgent Care Why: In 7 days for suture removal. Contact information: [address] [place] [address] [phone]              Reviewed expectations re: course of current medical issues. Questions answered. Outlined signs and symptoms indicating need for more acute intervention. Patient verbalized understanding. After Visit Summary given.   SUBJECTIVE:  Rachel Bowen is a 55 y.o. female who presents with a laceration of L hand; palm; approx 1 h ago; kitchen knife; bleeding controlled. No extremity sensation changes or weakness.   Td UTD: Yes.   Health Maintenance Due  Topic Date Due  . Hepatitis C Screening  Never done  . COLON CANCER SCREENING ANNUAL FOBT  12/12/2019    OBJECTIVE: General appearance: alert; no distress Skin: L hand with approx 2 cm linear thenar laceration; FROM of all fingers; normal distal sensation and capillary refill Psychological: alert and cooperative; normal mood and affect   Allergies  Allergen Reactions  . Amoxicillin Nausea And Vomiting     Reaction: unknown    Past Medical History:  Diagnosis Date  . Gestational diabetes   . Hyperlipidemia   . Prediabetes    Social History   Socioeconomic History  . Marital status: Single    Spouse name: Not on file  . Number of children: 1  . Years of education: Not on file  . Highest education level: Not on file  Occupational History  . Occupation: unemployed  Tobacco Use  . Smoking status: Former Smoker    Types: Cigarettes  . Smokeless tobacco: Never Used  Vaping Use  . Vaping Use: Never used  Substance and Sexual Activity  . Alcohol use: No  . Drug use: No  . Sexual activity: Not Currently    Birth control/protection: None  Other Topics Concern  . Not on file  Social History Narrative  . Not on file   Social Determinants of Health   Financial Resource Strain: Not on file  Food Insecurity: Not on file  Transportation Needs: Not on file  Physical Activity: Not on file  Stress: Not on file  Social Connections: Not on file         Vanessa Kick, MD 05/27/20 1353

## 2020-06-05 ENCOUNTER — Ambulatory Visit
Admission: EM | Admit: 2020-06-05 | Discharge: 2020-06-05 | Disposition: A | Discharge status: Home / Self Care | Payer: 59

## 2020-06-05 NOTE — ED Triage Notes (Signed)
Pt here for suture removal, 3 sutures removed, skin is well aproximated , pt states 2 sutures fell out

## 2020-11-29 ENCOUNTER — Other Ambulatory Visit: Payer: Self-pay | Admitting: Family Medicine

## 2020-11-29 DIAGNOSIS — R1032 Left lower quadrant pain: Secondary | ICD-10-CM

## 2020-12-25 ENCOUNTER — Inpatient Hospital Stay: Admission: RE | Admit: 2020-12-25 | Payer: 59 | Source: Ambulatory Visit

## 2021-01-10 ENCOUNTER — Ambulatory Visit
Admission: RE | Admit: 2021-01-10 | Discharge: 2021-01-10 | Disposition: A | Discharge status: Home / Self Care | Payer: 59 | Source: Ambulatory Visit | Attending: Family Medicine | Admitting: Family Medicine

## 2021-01-10 DIAGNOSIS — R1032 Left lower quadrant pain: Secondary | ICD-10-CM

## 2021-01-10 MED ORDER — IOPAMIDOL (ISOVUE-300) INJECTION 61%
100.0000 mL | Freq: Once | INTRAVENOUS | Status: AC | PRN
  Administered 2021-01-10: 100 mL via INTRAVENOUS

## 2021-07-04 ENCOUNTER — Ambulatory Visit (INDEPENDENT_AMBULATORY_CARE_PROVIDER_SITE_OTHER): Payer: Commercial Managed Care - HMO

## 2021-07-04 ENCOUNTER — Ambulatory Visit (INDEPENDENT_AMBULATORY_CARE_PROVIDER_SITE_OTHER): Payer: Commercial Managed Care - HMO | Admitting: Podiatry

## 2021-07-04 DIAGNOSIS — M778 Other enthesopathies, not elsewhere classified: Secondary | ICD-10-CM

## 2021-07-04 NOTE — Progress Notes (Signed)
? ?  HPI: 56 y.o. female presenting today for evaluation of pain and tenderness to the left foot over the past 3 weeks.  Over the past few days the pain has significantly improved however.  She states that today this completely asymptomatic.  She denies a history of injury. ?Patient is also slightly concerned with possible toenail fungus to the left great toenail.  She presents for further treatment and evaluation.  She has not done anything for treatment.  It is not painful but she has noticed some discoloration to the toenail.  Today there is toenail polish applied to the nail plate ? ?Past Medical History:  ?Diagnosis Date  ? Gestational diabetes   ? Hyperlipidemia   ? Prediabetes   ? ? ?Past Surgical History:  ?Procedure Laterality Date  ? NO PAST SURGERIES    ? ? ?Allergies  ?Allergen Reactions  ? Amoxicillin Nausea And Vomiting  ?  Reaction: unknown  ? ?  ?Physical Exam: ?General: The patient is alert and oriented x3 in no acute distress. ? ?Dermatology: Skin is warm, dry and supple bilateral lower extremities. Negative for open lesions or macerations.  Evaluating the left hallux nail plate it does appear that there is some detachment from the distal portion of the nail plate to the underlying nailbed with subungual debris possibly consistent with a toenail fungus ? ?Vascular: Palpable pedal pulses bilaterally. Capillary refill within normal limits.  Negative for any significant edema or erythema ? ?Neurological: Light touch and protective threshold grossly intact ? ?Musculoskeletal Exam: No pedal deformities noted.  No pain on palpation throughout the foot ? ?Radiographic Exam:  ?Normal osseous mineralization. Joint spaces preserved. No fracture/dislocation/boney destruction.   ? ?Assessment: ?1.  Capsulitis left foot; resolved ?2.  Onychomycosis of toenail left ? ? ?Plan of Care:  ?1. Patient evaluated. X-Rays reviewed.  ?2.  Since the pain to the left foot has essentially resolved on its own we will just  observe for now ?3.  We did discuss different treatment modalities to address the toenail fungus.  Recommend OTC topical antifungal.  Antifungal Tolcylen dispensed at checkout ?4.  Return to clinic as needed ? ?  ?  ?Edrick Kins, DPM ?[place] ? ?Dr. Edrick Kins, DPM  ?  ?[address]                                        ?[place], [place] [address]                ?Office [phone]  ?Fax [phone] ? ? ? ? ?

## 2021-07-12 ENCOUNTER — Telehealth: Payer: Self-pay | Admitting: Podiatry

## 2021-07-12 NOTE — Telephone Encounter (Signed)
Pt called in stated she was seen on 5/17 for nail fungus. She said she was prescribed a fungal RX for the nail, now the nail has gotten darker and lifting. Pt stated she is a diabetic. She wants to know is this normal and does she need to be seen again? Please advise.

## 2021-07-12 NOTE — Telephone Encounter (Signed)
Thank you :)

## 2021-07-12 NOTE — Telephone Encounter (Signed)
Returned the call to patient, is wanting to be scheduled with physician for a f/u appointment.

## 2021-07-12 NOTE — Telephone Encounter (Signed)
Patient would like to schedule a f/u appointment to have nail re examined because of the lifting and darkness, is using topical but concerned since she is a diabetic.

## 2021-07-12 NOTE — Telephone Encounter (Signed)
Pt has appt scheduled.

## 2021-07-18 ENCOUNTER — Ambulatory Visit: Payer: Commercial Managed Care - HMO | Admitting: Podiatry

## 2021-07-18 DIAGNOSIS — L603 Nail dystrophy: Secondary | ICD-10-CM

## 2021-07-18 NOTE — Progress Notes (Signed)
   HPI: 56 y.o. female presenting today because of the left great toenail issues.  Patient states that recently her left great toenail fell off.  She has noticed regrowth along the base of the nail however she would like to have it evaluated because she states that it is 'ugly' and she is going on a cruise.  No pain.  She has been applying the antifungal Tolcylen topical that was dispensed last visit.  Past Medical History:  Diagnosis Date   Gestational diabetes    Hyperlipidemia    Prediabetes     Past Surgical History:  Procedure Laterality Date   NO PAST SURGERIES      Allergies  Allergen Reactions   Amoxicillin Nausea And Vomiting    Reaction: unknown     Physical Exam: General: The patient is alert and oriented x3 in no acute distress.  Dermatology: Loss of toenail noted left hallux nail plate.  Nailbed is dry and stable with some superficial peeling callus/debris.  The base of the nail plate actually appears to be growing out nicely.  Vascular: Palpable pedal pulses bilaterally. Capillary refill within normal limits.  Negative for any significant edema or erythema  Neurological: Light touch and protective threshold grossly intact  Musculoskeletal Exam: No pedal deformities noted  Assessment: 1.  Idiopathic loss of toenail left hallux 2.  Onychomycosis of toenail left hallux   Plan of Care:  1. Patient evaluated.  2.  Light debridement of the debris and peeling skin was performed today using a tissue nipper without incident or bleeding 3.  Continue Tolcylen antifungal topical daily while the nail plate grows out 4.  Return to clinic as needed     Edrick Kins, DPM [facility]  Dr. Edrick Kins, DPM    [address], [place] [address]                Office [phone]  Fax [phone]

## 2021-08-11 IMAGING — CT CT HEAD W/O CM
3 series · 14 of 47 positions shown, 16 images · non-contrast
Comparison: Brain MRI 09/21/2014.

CLINICAL DATA: 54-year-old female with headache, numbness, ataxia.

EXAM:
CT HEAD WITHOUT CONTRAST
TECHNIQUE: Contiguous axial images were obtained from the base of the skull
through the vertex without intravenous contrast.

[Series 2: head wo · axial · 0.47mm/px · z∈[-153,-18]mm · 8 of 33 slices shown, 10 images]
[im 3/33  brain]
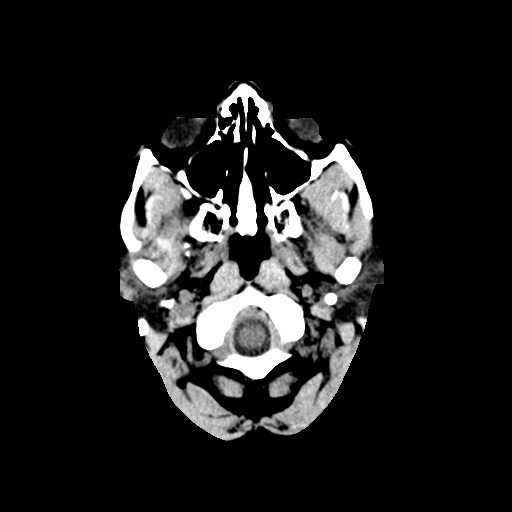
[im 3/33  bone]
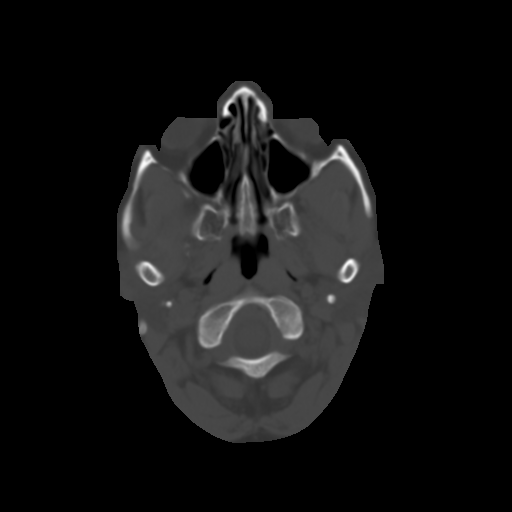
[im 7/33  brain]
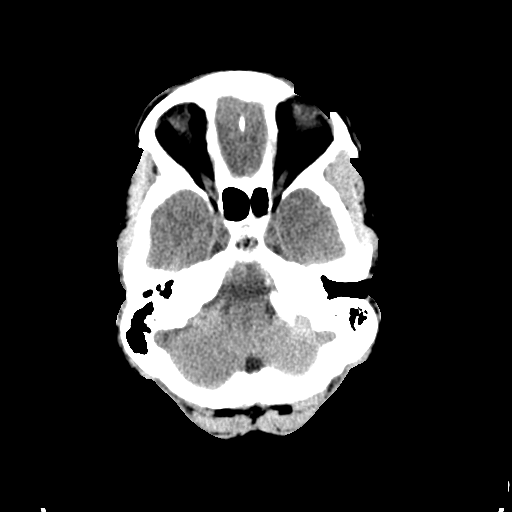
[im 10/33  brain]
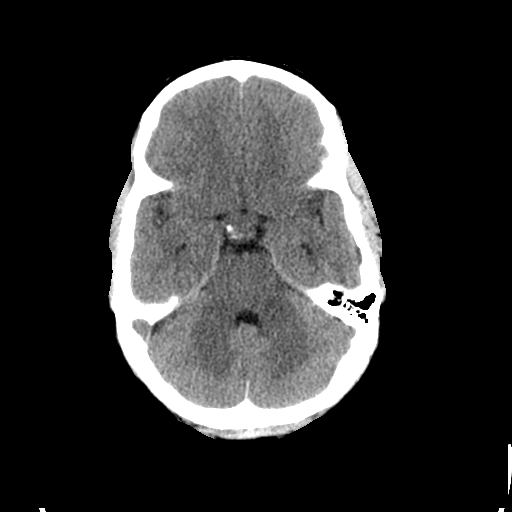
[im 15/33  brain]
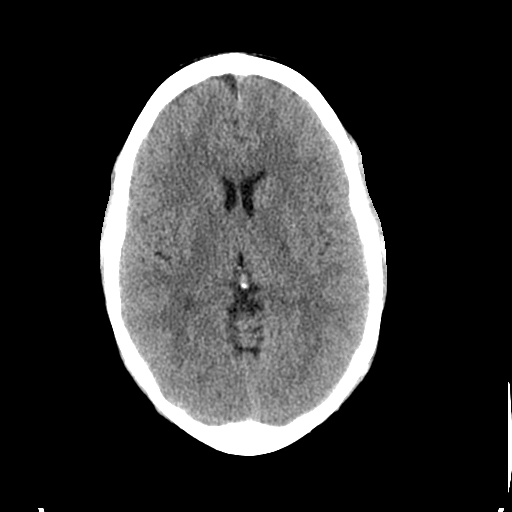
[im 18/33  brain]
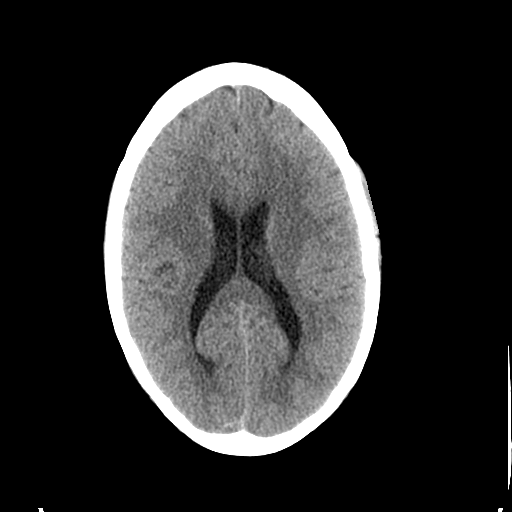
[im 18/33  bone]
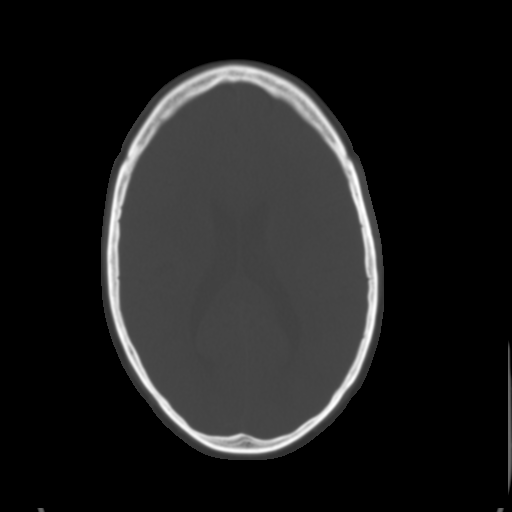
[im 23/33  brain]
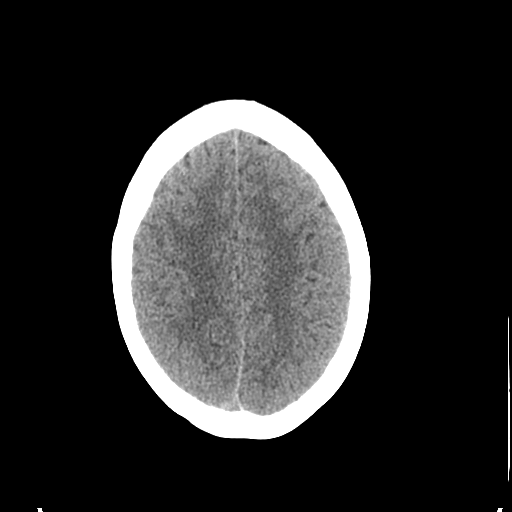
[im 26/33  brain]
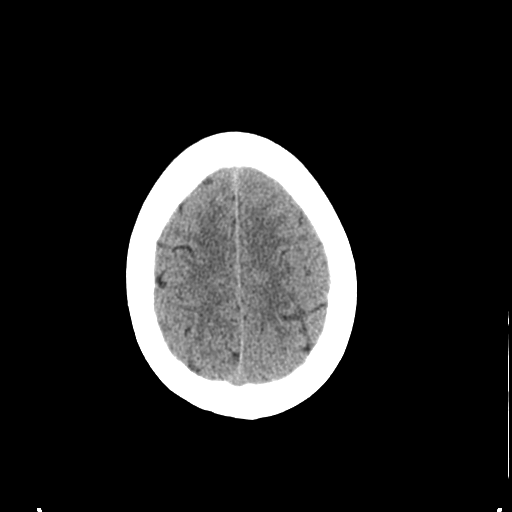
[im 30/33  brain]
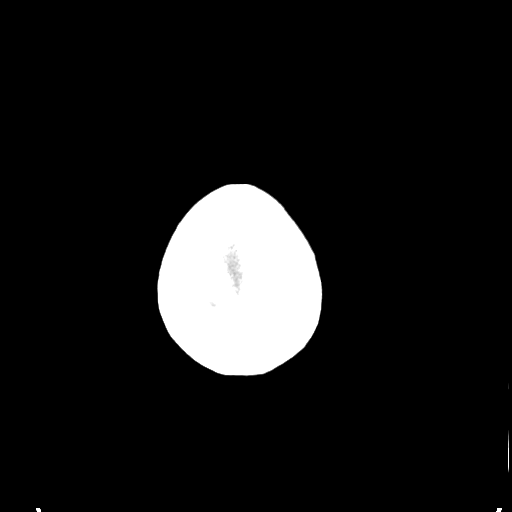

[Series 4: coronal soft tissue · coronal · 0.34mm/px · 3 of 62 slices shown]
[im 21/62  brain]
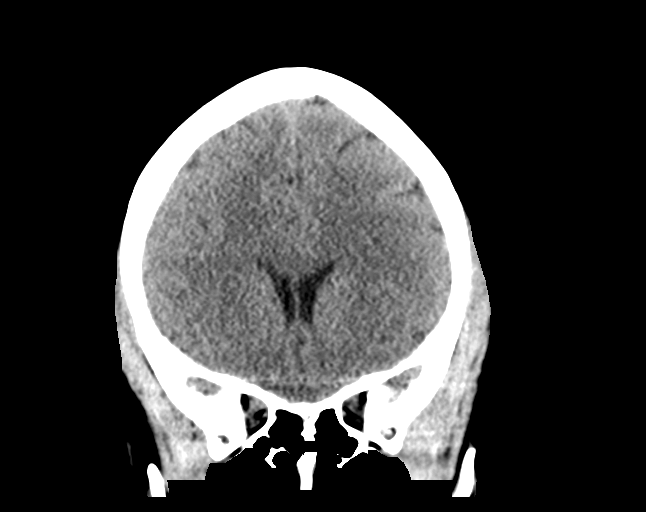
[im 28/62  brain]
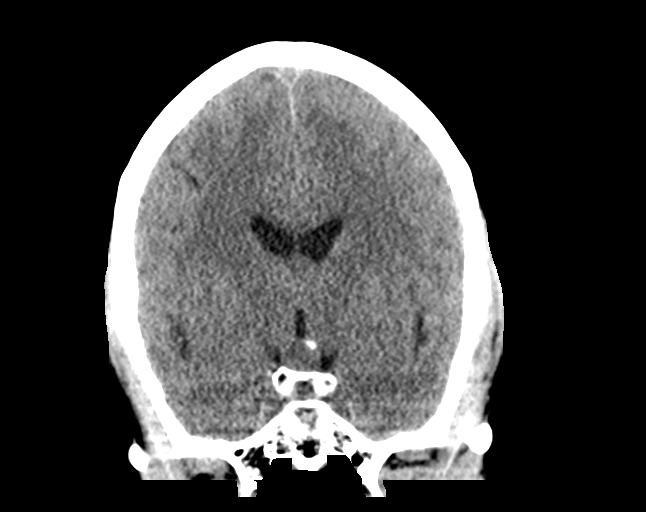
[im 34/62  brain]
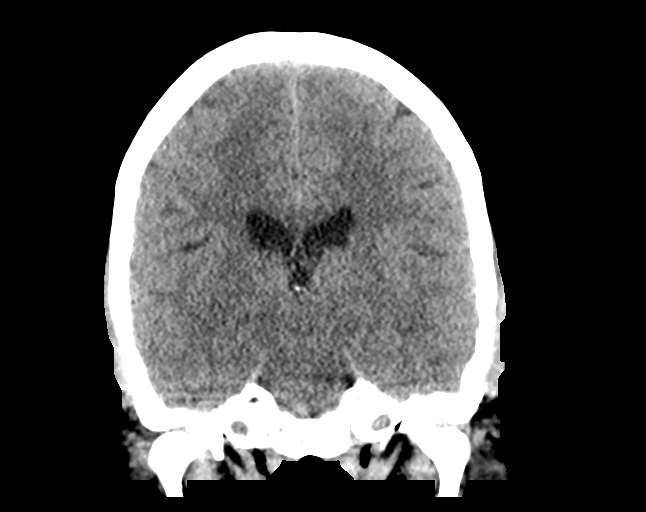

[Series 5: sagittal soft tissue · sagittal · 0.35mm/px · 3 of 46 slices shown]
[im 16/46  brain]
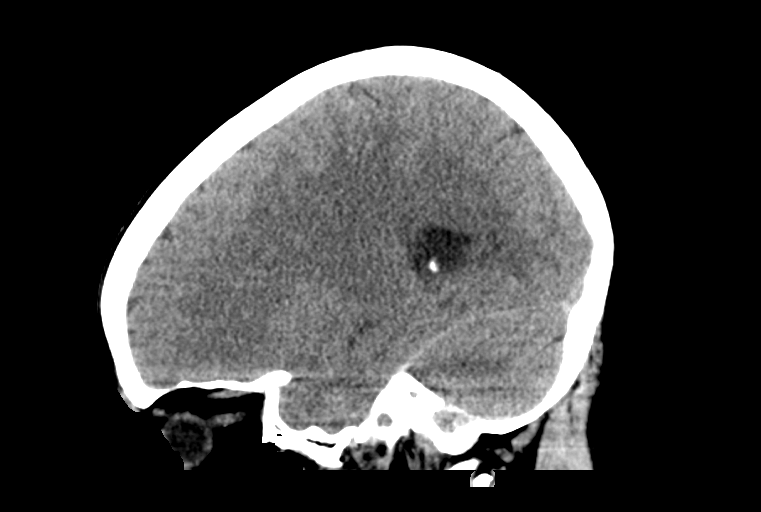
[im 23/46  brain]
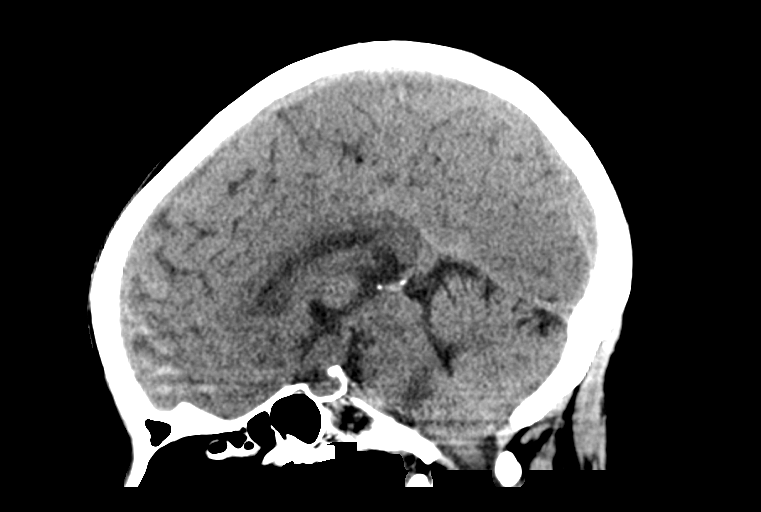
[im 31/46  brain]
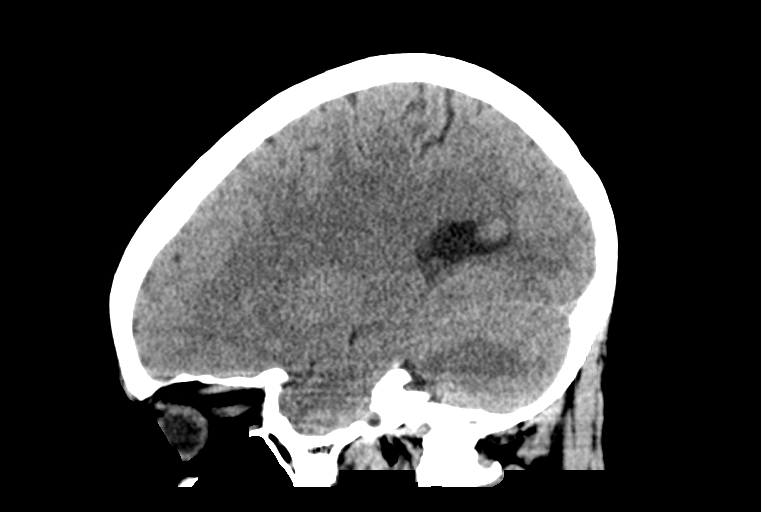

[14 of 47 positions shown; findings below may reference images not displayed]

FINDINGS: Brain: Increased masslike soft tissue enlargement at the
hypothalamus and probably contiguous with the pulmonary infundibulum
and chiasm which were abnormal on the 7824 MRI. Currently this
measures about 13 mm diameter (previously approximately 6 mm on
MRI). There is also partial calcification of the lesion as seen on
coronal image 28. No regional cerebral edema.

No other intracranial mass lesion is evident. No significant
intracranial mass effect. No ventriculomegaly. Gray-white matter
differentiation is within normal limits throughout the brain. No
acute intracranial hemorrhage identified. No cortically based acute
infarct identified.

Vascular: No suspicious intracranial vascular hyperdensity.

Skull: Negative.

Sinuses/Orbits: Visualized paranasal sinuses and mastoids are clear.

Other: Visualized orbits and scalp soft tissues are within normal
limits.
IMPRESSION: 1. Slowly enlarging hypothalamic region soft tissue mass, which
appears roughly doubled in size since a 7824 MRI. The lesion is
partially calcified.
Recommend repeat MRI Head without and with contrast (pituitary
protocol preferably, although addition of thin coronal T2 weighted
imaging may also be helpful).
Top differential considerations include craniopharyngioma,
hypothalamic hamartoma, low-grade glioma, or perhaps inflammatory
etiology such as Langerhans cell histiocytosis, neurosarcoidosis.

2. No other acute intracranial abnormality.

## 2021-08-11 IMAGING — MR MR HEAD WO/W CM
27 of 28 series · 44 of 48 positions shown · IV contrast (gadavist)
Comparison: Head CT 04/22/2019 and MRI 10/01/2014

CLINICAL DATA: Headache and left-sided numbness.

EXAM:
MRI HEAD WITHOUT AND WITH CONTRAST
TECHNIQUE: Multiplanar, multiecho pulse sequences of the brain and surrounding
structures were obtained without and with intravenous contrast.
CONTRAST:  5mL GADAVIST GADOBUTROL 1 MMOL/ML IV SOLN

[Series 5: T1 · sagittal · 5.0mm · 0.75mm/px · 1 of 24 slices shown (1 of 4)]
[im 1/24]
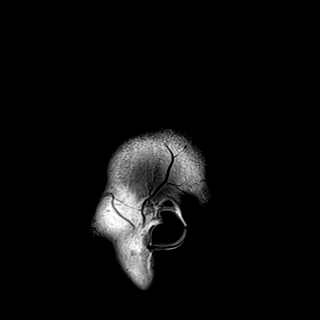

[Series 6: T2 · axial · 5.0mm · 0.62mm/px · 1 of 28 slices shown (1 of 2)]
[im 1/28]
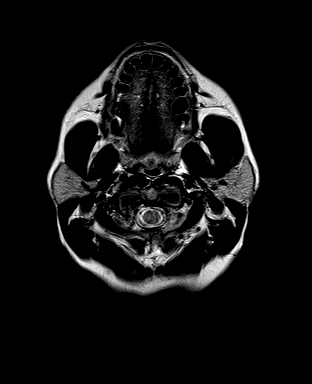

[Series 7: DWI · axial · 3.0mm · 1.36mm/px · z∈[-127,+26]mm · 3 of 108 slices shown (1 of 4)]
[im 1/108]
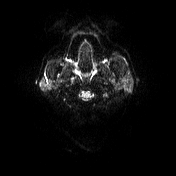
[im 54/108]
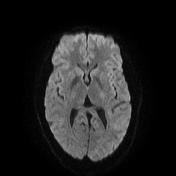
[im 108/108]
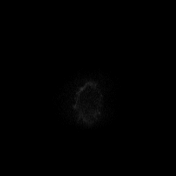

[Series 8: DWI · axial · 3.0mm · 1.36mm/px · 1 of 54 slices shown (2 of 4)]
[im 1/54]
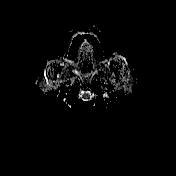

[Series 9: mip_images(sw) · axial · 24.0mm · 0.75mm/px · 1 of 49 slices shown]
[im 1/49]
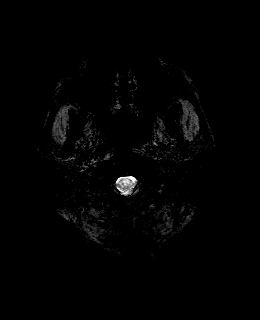

[Series 10: swi_images · axial · 3.0mm · 0.75mm/px · 1 of 56 slices shown]
[im 1/56]
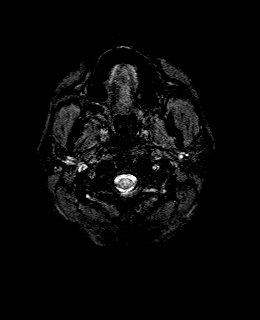

[Series 11: FLAIR · axial · 3.0mm · 0.75mm/px · z∈[-123,+25]mm · 2 of 52 slices shown]
[im 1/52]
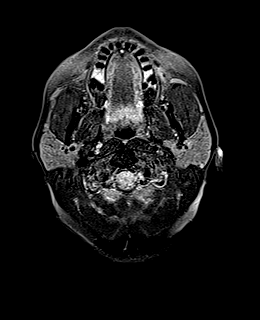
[im 52/52]
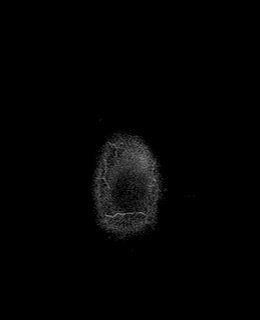

[Series 12: T1 · axial · 1.0mm · 0.94mm/px · z∈[-120,+35]mm · 6 of 160 slices shown (2 of 4)]
[im 1/160]
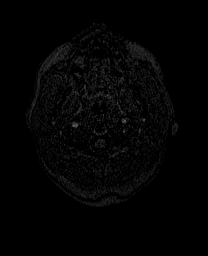
[im 32/160]
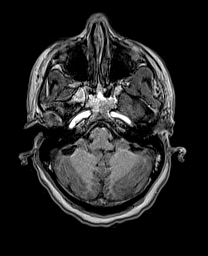
[im 64/160]
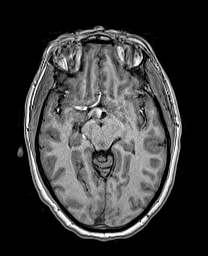
[im 96/160]
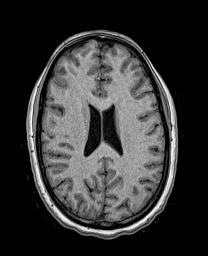
[im 128/160]
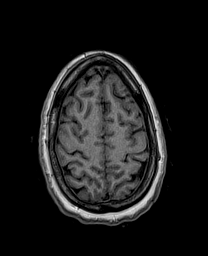
[im 160/160]
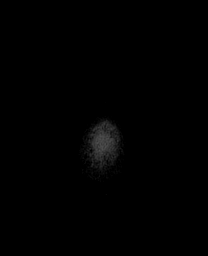

[Series 13: DWI · coronal · 5.0mm · 1.31mm/px · 2 of 64 slices shown (3 of 4)]
[im 1/64]
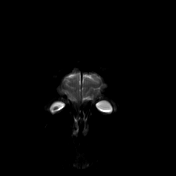
[im 64/64]
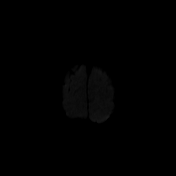

[Series 14: DWI · coronal · 5.0mm · 1.31mm/px · 1 of 32 slices shown (4 of 4)]
[im 1/32]
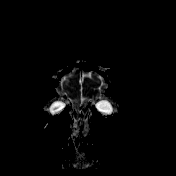

[Series 15: T2 · coronal · 5.0mm · 0.57mm/px · 1 of 32 slices shown (2 of 2)]
[im 1/32]
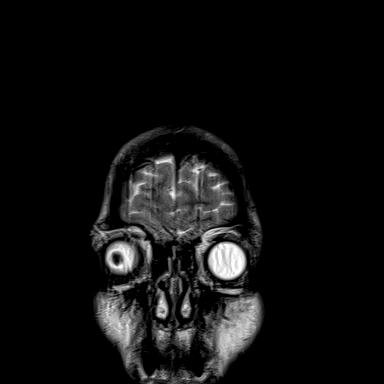

[Series 16: T1 · sagittal · 3.0mm · 0.35mm/px · 1 of 13 slices shown (3 of 4)]
[im 1/13]
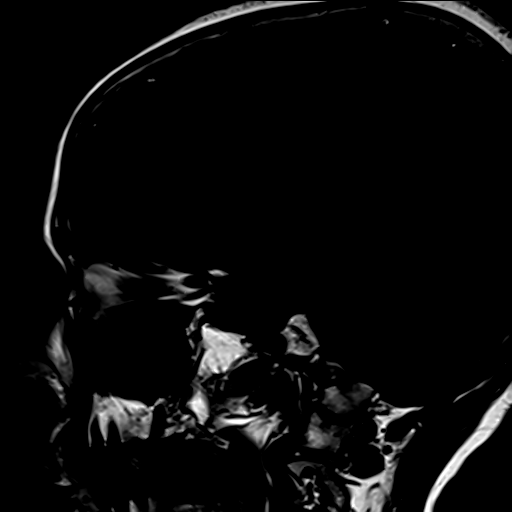

[Series 17: T1 · coronal · non-contrast · 3.0mm · 0.31mm/px · 1 of 15 slices shown (4 of 4)]
[im 1/15]
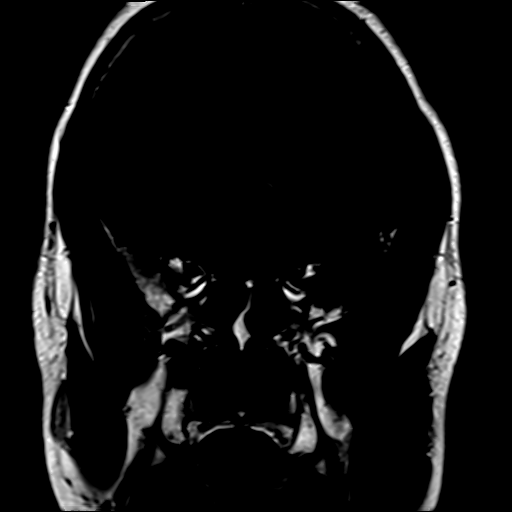

[Series 20: t1_vibe_cor_dynamic pre · coronal · non-contrast · 3.0mm · 0.69mm/px · 1 of 12 slices shown]
[im 1/12]
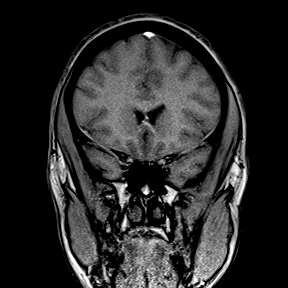

[Series 21: t1_vibe_cor_dynamic · coronal · 3.0mm · 0.69mm/px · 1 of 12 slices shown (1 of 6)]
[im 1/12]
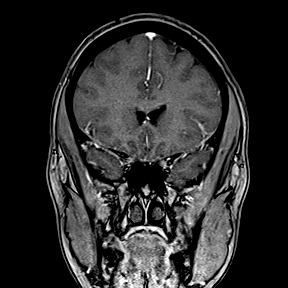

[Series 22: t1_vibe_cor_dynamic · coronal · 3.0mm · 0.69mm/px · 1 of 12 slices shown (2 of 6)]
[im 1/12]
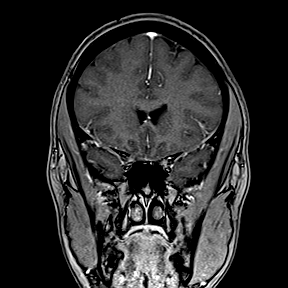

[Series 23: t1_vibe_cor_dynamic · coronal · 3.0mm · 0.69mm/px · 1 of 12 slices shown (3 of 6)]
[im 1/12]
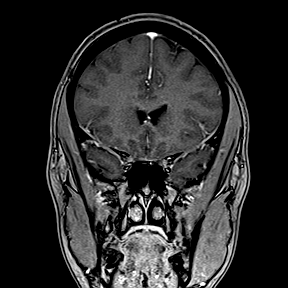

[Series 24: t1_vibe_cor_dynamic · coronal · 3.0mm · 0.69mm/px · 1 of 12 slices shown (4 of 6)]
[im 1/12]
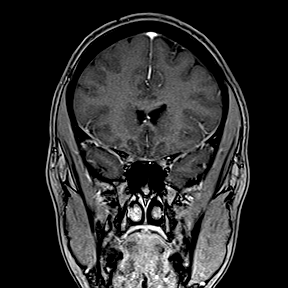

[Series 25: t1_vibe_cor_dynamic · coronal · 3.0mm · 0.69mm/px · 1 of 12 slices shown (5 of 6)]
[im 1/12]
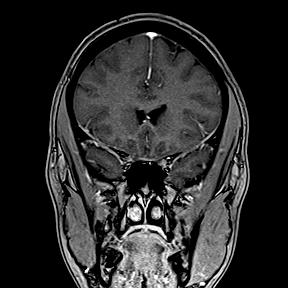

[Series 26: t1_vibe_cor_dynamic · coronal · 3.0mm · 0.69mm/px · 1 of 12 slices shown (6 of 6)]
[im 1/12]
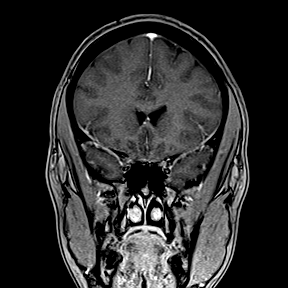

[Series 27: T1 post-contrast · sagittal · 3.0mm · 0.35mm/px · 1 of 13 slices shown (1 of 4)]
[im 1/13]
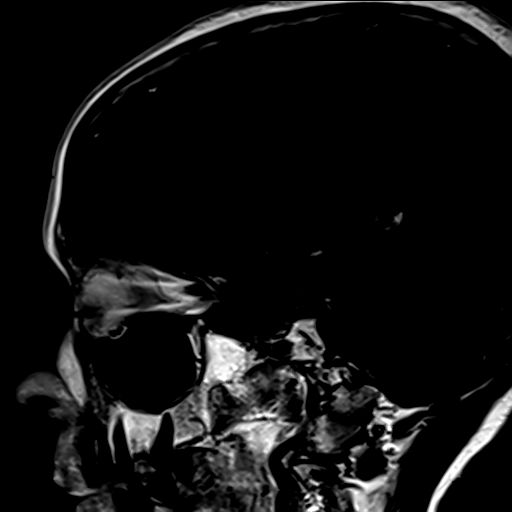

[Series 28: T1 post-contrast · coronal · 3.0mm · 0.31mm/px · 1 of 15 slices shown (2 of 4)]
[im 1/15]
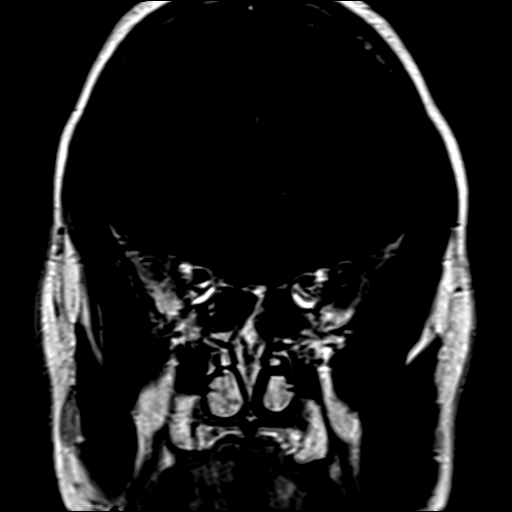

[Series 29: T1 post-contrast · axial · 1.0mm · 0.94mm/px · z∈[-134,+17]mm · 6 of 160 slices shown (3 of 4)]
[im 1/160]
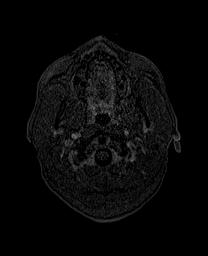
[im 32/160]
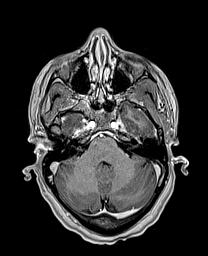
[im 64/160]
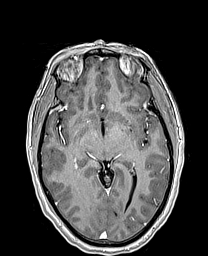
[im 96/160]
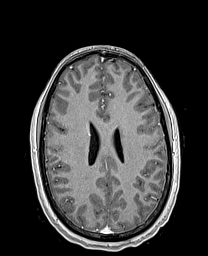
[im 128/160]
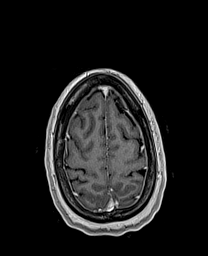
[im 160/160]
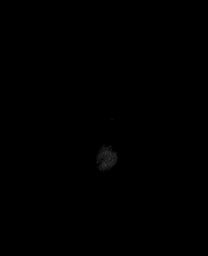

[Series 30: T1 post-contrast · coronal · 5.0mm · 0.43mm/px · 1 of 32 slices shown (4 of 4)]
[im 1/32]
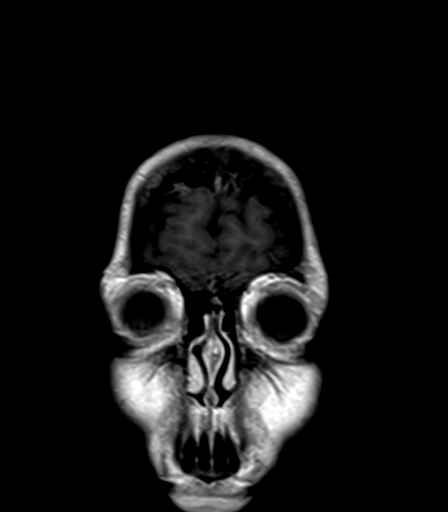

[Series 100: <mpr range> · axial · 1.0mm · 0.94mm/px · z∈[-119,+75]mm · 2 of 53 slices shown]
[im 1/53]
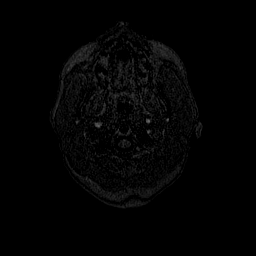
[im 53/53]
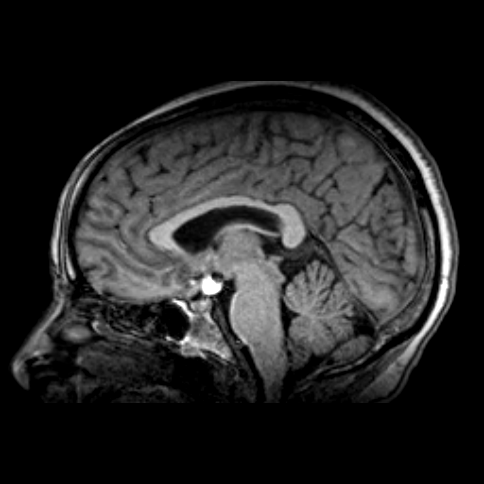

[Series 101: <mpr range(1)> · axial · 1.0mm · 0.50mm/px · z∈[+60,+113]mm · 2 of 61 slices shown]
[im 1/61]
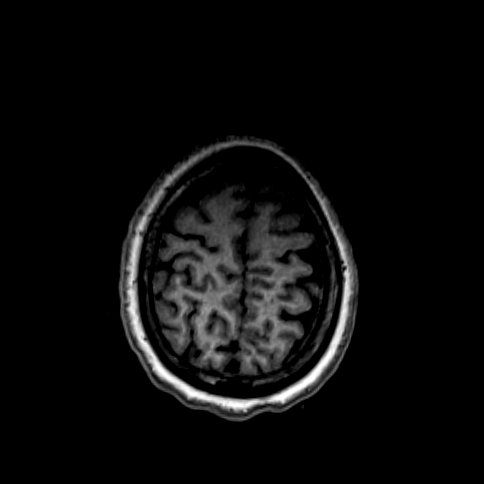
[im 61/61]
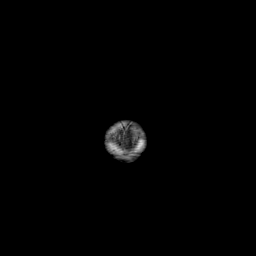

[Series 102: <mpr range(2)> · axial · 1.0mm · 0.50mm/px · z∈[+62,+104]mm · 2 of 56 slices shown]
[im 1/56]
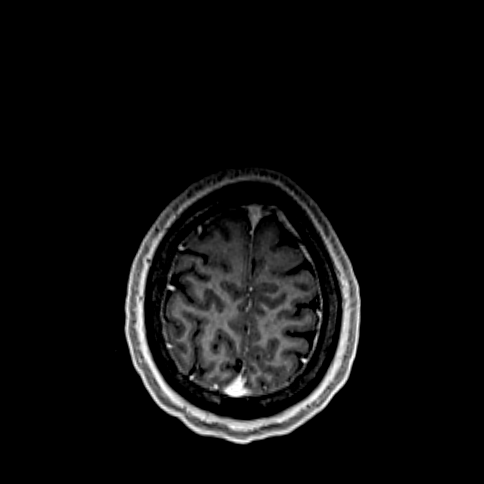
[im 56/56]
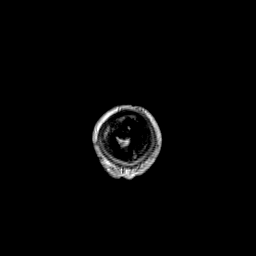

[44 of 48 positions shown; findings below may reference images not displayed]

FINDINGS: The study is mildly motion degraded.

Brain: There is a 1.2 x 1.1 x 1.2 cm hypothalamic region mass which
has substantially enlarged from the 8260 MRI and was shown to be
partially calcified on today's CT. The mass is contiguous with the
superior aspect of the pituitary infundibulum and is predominantly
T1 hyperintense with susceptibility artifact consistent with blood
products. Abnormal T1 hyperintensity also extends inferiorly
throughout the infundibulum and into the posterior aspect of the
pituitary gland in the same distribution where there was
nonenhancing soft tissue on the prior MRI, however the involvement
of the posterior pituitary gland is greater now. No appreciable
enhancement of the mass is identified, either in the suprasellar or
intrasellar components although intrinsic T1 hyperintensity limits
assessment. The mass may involve the optic chiasm, and there is
chronic thickening of the prechiasmatic optic nerves. The remainder
of the pituitary gland enhances normally.

No acute infarct, midline shift, or extra-axial fluid collection is
present. Scattered punctate foci of T2 hyperintensity in the
cerebral white matter bilaterally have slightly increased in number
from the prior MRI and are minimally prominent for age. The
ventricles and sulci are normal.

Vascular: Major intracranial vascular flow voids are preserved.

Skull and upper cervical spine: Unremarkable bone marrow signal.

Sinuses/Orbits: Unremarkable orbits. Paranasal sinuses and mastoid
air cells are clear.

Other: None.
IMPRESSION: 1. Suprasellar/hypothalamic region mass with involvement of the
infundibulum and posterior pituitary gland. The mass has enlarged
from 8260 and now appears hemorrhagic without appreciable
enhancement. The appearance remains nonspecific, however the
enlargement is concerning for a neoplasm such as glioma (including
granular cell tumor and pituicytoma) and craniopharyngioma. An
inflammatory or granulomatous process is considered less likely.
2. Minimal cerebral white matter T2 signal changes, nonspecific
though may reflect chronic small vessel ischemia, migraines, or
prior infection/inflammation.

## 2021-09-05 ENCOUNTER — Other Ambulatory Visit: Payer: Self-pay | Admitting: Family Medicine

## 2021-09-05 DIAGNOSIS — E230 Hypopituitarism: Secondary | ICD-10-CM

## 2021-09-05 DIAGNOSIS — E2839 Other primary ovarian failure: Secondary | ICD-10-CM

## 2021-11-13 ENCOUNTER — Ambulatory Visit
Admission: EM | Admit: 2021-11-13 | Discharge: 2021-11-13 | Disposition: A | Discharge status: Home / Self Care | Payer: Medicare Other | Attending: Physician Assistant | Admitting: Physician Assistant

## 2021-11-13 DIAGNOSIS — M545 Low back pain, unspecified: Secondary | ICD-10-CM

## 2021-11-13 LAB — POCT URINALYSIS DIP (MANUAL ENTRY)
Bilirubin, UA: NEGATIVE
Blood, UA: NEGATIVE
Glucose, UA: NEGATIVE mg/dL
Ketones, POC UA: NEGATIVE mg/dL
Leukocytes, UA: NEGATIVE
Nitrite, UA: NEGATIVE
Protein Ur, POC: NEGATIVE mg/dL
Spec Grav, UA: 1.02 (ref 1.010–1.025)
Urobilinogen, UA: 0.2 E.U./dL
pH, UA: 6 (ref 5.0–8.0)

## 2021-11-13 MED ORDER — CYCLOBENZAPRINE HCL 10 MG PO TABS: 10.0000 mg | ORAL_TABLET | Freq: Two times a day (BID) | ORAL | 0 refills | Status: AC | PRN

## 2021-11-13 NOTE — ED Provider Notes (Signed)
[facility]    CSN: [id number] Arrival date & time: 11/13/21  1021      History   Chief Complaint Chief Complaint  Patient presents with   Back Pain    HPI Rachel Bowen is a 56 y.o. female.   Patient here today for evaluation of back pain that starts in her lower back and radiates into her lower abdomen.  She does note that she has constipation at baseline and is not sure if this is related.  Bending over does seem to make pain worse.  She denies any numbness or tingling.  She has tried her typical medications without significant relief.  The history is provided by the patient.  Back Pain Associated symptoms: no dysuria and no fever     Past Medical History:  Diagnosis Date   Gestational diabetes    Hyperlipidemia    Prediabetes     Patient Active Problem List   Diagnosis Date Noted   Brain mass 04/26/2019   Prediabetes 04/18/2017    Past Surgical History:  Procedure Laterality Date   NO PAST SURGERIES      OB History   No obstetric history on file.      Home Medications    Prior to Admission medications   Medication Sig Start Date End Date Taking? Authorizing Provider  cyclobenzaprine (FLEXERIL) 10 MG tablet Take 1 tablet (10 mg total) by mouth 2 (two) times daily as needed for muscle spasms. 11/13/21  Yes Francene Finders, PA-C  desmopressin (DDAVP) 0.1 MG tablet Take by mouth. 07/02/21   [provider]  dexamethasone (DECADRON) 2 MG tablet Take 1 tablet (2 mg total) by mouth daily. 05/31/19   Vaslow, Acey Lav, MD  dexamethasone (DECADRON) 4 MG tablet dexamethasone 4 mg tablet  TAKE 1 TABLET (4 MG TOTAL) BY MOUTH DAILY.    [provider]  escitalopram (LEXAPRO) 10 MG tablet Take 10 mg by mouth daily. 06/11/21   [provider]  ferrous sulfate 325 (65 FE) MG tablet Take 325 mg by mouth daily. 04/20/19 05/20/19  [provider]  glucose blood (FREESTYLE LITE) test strip USE THREE TIMES DAILY AS NEEDED. 03/16/21  03/16/22  [provider]  hydrocortisone (CORTEF) 10 MG tablet Hydrocortisone 10 mg am and half pill (5 mg) at lunch and 5 mg 4 pm 05/01/21   [provider]  hydrOXYzine (ATARAX) 10 MG tablet Take 10 mg by mouth at bedtime as needed. 06/11/21   [provider]  levothyroxine (SYNTHROID) 50 MCG tablet Take by mouth. 06/11/21   [provider]  LORazepam (ATIVAN) 0.5 MG tablet Take one tablet by mouth one hour prior to MRI. May repeat after 30 minutes if needed for anxiety 05/14/21   [provider]  pantoprazole (PROTONIX) 40 MG tablet Take 40 mg by mouth in the morning and at bedtime. x14 days 04/18/19 05/02/19  [provider]    Family History Family History  Problem Relation Age of Onset   Lung cancer Mother    Prostate cancer Father    Colon cancer Maternal Grandfather    Rectal cancer Neg Hx     Social History Social History   Tobacco Use   Smoking status: Former    Types: Cigarettes   Smokeless tobacco: Never  Vaping Use   Vaping Use: Never used  Substance Use Topics   Alcohol use: No   Drug use: No     Allergies   Amoxicillin   Review of Systems  Review of Systems  Constitutional:  Negative for chills and fever.  Eyes:  Negative for discharge and redness.  Respiratory:  Negative for shortness of breath.   Gastrointestinal:  Negative for blood in stool, nausea and vomiting.  Genitourinary:  Negative for dysuria and flank pain.  Musculoskeletal:  Positive for back pain.     Physical Exam Triage Vital Signs ED Triage Vitals [11/13/21 1112]  Enc Vitals Group     BP 138/79     Pulse Rate 65     Resp      Temp 98 F (36.7 C)     Temp Source Oral     SpO2 98 %     Weight      Height      Head Circumference      Peak Flow      Pain Score 8     Pain Loc      Pain Edu?      Excl. in [place]?    No data found.  Updated Vital Signs BP 138/79 (BP Location: Left Arm)   Pulse 65   Temp 98 F (36.7 C) (Oral)    LMP 07/02/2012   SpO2 98%      Physical Exam Vitals and nursing note reviewed.  Constitutional:      General: She is not in acute distress.    Appearance: Normal appearance. She is not ill-appearing.  HENT:     Head: Normocephalic and atraumatic.  Eyes:     Conjunctiva/sclera: Conjunctivae normal.  Cardiovascular:     Rate and Rhythm: Normal rate.  Pulmonary:     Effort: Pulmonary effort is normal. No respiratory distress.  Musculoskeletal:     Comments: No TTP to midline thoracic or lumbar spine, Mild TTP across low back  Neurological:     Mental Status: She is alert.  Psychiatric:        Mood and Affect: Mood normal.        Behavior: Behavior normal.      UC Treatments / Results  Labs (all labs ordered are listed, but only abnormal results are displayed) Labs Reviewed  POCT URINALYSIS DIP (MANUAL ENTRY)    EKG   Radiology No results found.  Procedures Procedures (including critical care time)  Medications Ordered in UC Medications - No data to display  Initial Impression / Assessment and Plan / UC Course  I have reviewed the triage vital signs and the nursing notes.  Pertinent labs & imaging results that were available during my care of the patient were reviewed by me and considered in my medical decision making (see chart for details).    Will trial muscle relaxer to cover musculoskeletal cause of pain given normal UA in office. Recommended follow up with her specialist if no improvement and discussed that constipation may be a cause of her current symptoms as well. Encouraged follow up with any further concerns.   Final Clinical Impressions(s) / UC Diagnoses   Final diagnoses:  Acute bilateral low back pain without sciatica   Discharge Instructions   None    ED Prescriptions     Medication Sig Dispense Auth. Provider   cyclobenzaprine (FLEXERIL) 10 MG tablet Take 1 tablet (10 mg total) by mouth 2 (two) times daily as needed for muscle spasms. 20  tablet Francene Finders, PA-C      PDMP not reviewed this encounter.   Francene Finders, PA-C 11/13/21 1258

## 2021-11-13 NOTE — ED Triage Notes (Signed)
Pt c/o back pain that radiates towards her lower abdomen

## 2021-11-14 ENCOUNTER — Emergency Department (HOSPITAL_COMMUNITY)
Admission: EM | Admit: 2021-11-14 | Discharge: 2021-11-14 | Disposition: A | Discharge status: Home / Self Care | Payer: Medicare Other | Attending: Emergency Medicine | Admitting: Emergency Medicine

## 2021-11-14 ENCOUNTER — Other Ambulatory Visit: Payer: Self-pay

## 2021-11-14 ENCOUNTER — Emergency Department (HOSPITAL_COMMUNITY): Payer: Medicare Other

## 2021-11-14 DIAGNOSIS — M5442 Lumbago with sciatica, left side: Secondary | ICD-10-CM | POA: Diagnosis not present

## 2021-11-14 DIAGNOSIS — M545 Low back pain, unspecified: Secondary | ICD-10-CM | POA: Diagnosis not present

## 2021-11-14 DIAGNOSIS — R319 Hematuria, unspecified: Secondary | ICD-10-CM | POA: Insufficient documentation

## 2021-11-14 DIAGNOSIS — I878 Other specified disorders of veins: Secondary | ICD-10-CM | POA: Diagnosis not present

## 2021-11-14 DIAGNOSIS — I700 Atherosclerosis of aorta: Secondary | ICD-10-CM | POA: Diagnosis not present

## 2021-11-14 DIAGNOSIS — M5441 Lumbago with sciatica, right side: Secondary | ICD-10-CM | POA: Diagnosis not present

## 2021-11-14 DIAGNOSIS — R109 Unspecified abdominal pain: Secondary | ICD-10-CM | POA: Diagnosis not present

## 2021-11-14 DIAGNOSIS — R1084 Generalized abdominal pain: Secondary | ICD-10-CM | POA: Diagnosis not present

## 2021-11-14 LAB — CBC WITH DIFFERENTIAL/PLATELET
Abs Immature Granulocytes: 0.01 10*3/uL (ref 0.00–0.07)
Basophils Absolute: 0 10*3/uL (ref 0.0–0.1)
Basophils Relative: 1 %
Eosinophils Absolute: 0 10*3/uL (ref 0.0–0.5)
Eosinophils Relative: 0 %
HCT: 43.1 % (ref 36.0–46.0)
Hemoglobin: 13.7 g/dL (ref 12.0–15.0)
Immature Granulocytes: 0 %
Lymphocytes Relative: 34 %
Lymphs Abs: 2.1 10*3/uL (ref 0.7–4.0)
MCH: 29.3 pg (ref 26.0–34.0)
MCHC: 31.8 g/dL (ref 30.0–36.0)
MCV: 92.3 fL (ref 80.0–100.0)
Monocytes Absolute: 0.5 10*3/uL (ref 0.1–1.0)
Monocytes Relative: 8 %
Neutro Abs: 3.7 10*3/uL (ref 1.7–7.7)
Neutrophils Relative %: 57 %
Platelets: 219 10*3/uL (ref 150–400)
RBC: 4.67 MIL/uL (ref 3.87–5.11)
RDW: 14.6 % (ref 11.5–15.5)
WBC: 6.3 10*3/uL (ref 4.0–10.5)
nRBC: 0 % (ref 0.0–0.2)

## 2021-11-14 LAB — URINALYSIS, ROUTINE W REFLEX MICROSCOPIC
Bilirubin Urine: NEGATIVE
Glucose, UA: NEGATIVE mg/dL
Hgb urine dipstick: NEGATIVE
Ketones, ur: NEGATIVE mg/dL
Leukocytes,Ua: NEGATIVE
Nitrite: NEGATIVE
Protein, ur: NEGATIVE mg/dL
Specific Gravity, Urine: 1.017 (ref 1.005–1.030)
pH: 6 (ref 5.0–8.0)

## 2021-11-14 LAB — COMPREHENSIVE METABOLIC PANEL
ALT: 22 U/L (ref 0–44)
AST: 22 U/L (ref 15–41)
Albumin: 4.2 g/dL (ref 3.5–5.0)
Alkaline Phosphatase: 55 U/L (ref 38–126)
Anion gap: 7 (ref 5–15)
BUN: 20 mg/dL (ref 6–20)
CO2: 25 mmol/L (ref 22–32)
Calcium: 9.2 mg/dL (ref 8.9–10.3)
Chloride: 108 mmol/L (ref 98–111)
Creatinine, Ser: 0.95 mg/dL (ref 0.44–1.00)
GFR, Estimated: >60 mL/min (ref >60)
Glucose, Bld: 98 mg/dL (ref 70–99)
Potassium: 4.4 mmol/L (ref 3.5–5.1)
Sodium: 140 mmol/L (ref 135–145)
Total Bilirubin: 0.4 mg/dL (ref 0.3–1.2)
Total Protein: 7.5 g/dL (ref 6.5–8.1)

## 2021-11-14 MED ORDER — IBUPROFEN 200 MG PO TABS
ORAL_TABLET | ORAL | Status: AC
  Filled 2021-11-14: qty 1

## 2021-11-14 MED ORDER — IBUPROFEN 200 MG PO TABS
600.0000 mg | ORAL_TABLET | Freq: Once | ORAL | Status: AC
  Administered 2021-11-14: 600 mg via ORAL
  Filled 2021-11-14: qty 3

## 2021-11-14 NOTE — ED Provider Notes (Signed)
[facility] DEPT Provider Note   CSN: [id number] Arrival date & time: 11/14/21  1555     History  Chief Complaint  Patient presents with   Back Pain   HPI Rachel Bowen is a 56 y.o. female presenting for lower back pain and and concern for renal stone.  Back pain started on Sunday.  Patient was using the restroom when she stood up she "felt a catch".  Entire lower back was immediately painful.  Pain at times radiates around the abdomen.  She was seen prior primary care provider, [place].  Their work-up revealed hematuria from urinalysis prompted concern for possible kidney stone.  Her medical provider advised that she be further evaluated here in the ED for possible kidney stone.  Patient denies any painful urination, malodorous urine.  Did endorse increased urinary frequency.  Patient also denies fever, saddle anesthesia, recent back surgeries, and midline tenderness.   Back Pain      Home Medications Prior to Admission medications   Medication Sig Start Date End Date Taking? Authorizing Provider  cyclobenzaprine (FLEXERIL) 10 MG tablet Take 1 tablet (10 mg total) by mouth 2 (two) times daily as needed for muscle spasms. 11/13/21   Francene Finders, PA-C  desmopressin (DDAVP) 0.1 MG tablet Take by mouth. 07/02/21   [provider]  dexamethasone (DECADRON) 2 MG tablet Take 1 tablet (2 mg total) by mouth daily. 05/31/19   Vaslow, Acey Lav, MD  dexamethasone (DECADRON) 4 MG tablet dexamethasone 4 mg tablet  TAKE 1 TABLET (4 MG TOTAL) BY MOUTH DAILY.    [provider]  escitalopram (LEXAPRO) 10 MG tablet Take 10 mg by mouth daily. 06/11/21   [provider]  ferrous sulfate 325 (65 FE) MG tablet Take 325 mg by mouth daily. 04/20/19 05/20/19  [provider]  glucose blood (FREESTYLE LITE) test strip USE THREE TIMES DAILY AS NEEDED. 03/16/21 03/16/22  [provider]  hydrocortisone (CORTEF) 10 MG tablet  Hydrocortisone 10 mg am and half pill (5 mg) at lunch and 5 mg 4 pm 05/01/21   [provider]  hydrOXYzine (ATARAX) 10 MG tablet Take 10 mg by mouth at bedtime as needed. 06/11/21   [provider]  levothyroxine (SYNTHROID) 50 MCG tablet Take by mouth. 06/11/21   [provider]  LORazepam (ATIVAN) 0.5 MG tablet Take one tablet by mouth one hour prior to MRI. May repeat after 30 minutes if needed for anxiety 05/14/21   [provider]  pantoprazole (PROTONIX) 40 MG tablet Take 40 mg by mouth in the morning and at bedtime. x14 days 04/18/19 05/02/19  [provider]      Allergies    Amoxicillin    Review of Systems   Review of Systems  Musculoskeletal:  Positive for back pain.    Physical Exam Updated Vital Signs BP 139/82 (BP Location: Left Arm)   Pulse 70   Temp 99.3 F (37.4 C) (Oral)   Resp 16   Ht 5' 2"  (1.575 m)   Wt 75 kg   LMP 07/02/2012   SpO2 97%   BMI 30.24 kg/m  Physical Exam Vitals and nursing note reviewed.  Constitutional:      Appearance: Normal appearance.  HENT:     Head: Normocephalic and atraumatic.     Nose: Nose normal.     Mouth/Throat:     Mouth: Mucous membranes are moist.  Eyes:     General:  Right eye: No discharge.        Left eye: No discharge.     Conjunctiva/sclera: Conjunctivae normal.  Cardiovascular:     Rate and Rhythm: Normal rate and regular rhythm.     Pulses: Normal pulses.     Heart sounds: Normal heart sounds.  Pulmonary:     Effort: Pulmonary effort is normal.     Breath sounds: Normal breath sounds.  Abdominal:     General: Abdomen is flat.     Palpations: Abdomen is soft.     Tenderness: There is no right CVA tenderness or left CVA tenderness.  Musculoskeletal:     Comments: Grossly normal range of motion of her back.  Pain elicited upon back extension.  Paraspinal lumbar tenderness on palpation.  Skin:    General: Skin is warm and dry.  Neurological:     General: No  focal deficit present.     Mental Status: She is alert.  Psychiatric:        Mood and Affect: Mood normal.     ED Results / Procedures / Treatments   Labs (all labs ordered are listed, but only abnormal results are displayed) Labs Reviewed  COMPREHENSIVE METABOLIC PANEL  CBC WITH DIFFERENTIAL/PLATELET  URINALYSIS, ROUTINE W REFLEX MICROSCOPIC    EKG None  Radiology CT Renal Stone Study  Result Date: 11/14/2021 CLINICAL DATA:  Flank pain, kidney stone suspected EXAM: CT ABDOMEN AND PELVIS WITHOUT CONTRAST TECHNIQUE: Multidetector CT imaging of the abdomen and pelvis was performed following the standard protocol without IV contrast. RADIATION DOSE REDUCTION: This exam was performed according to the departmental dose-optimization program which includes automated exposure control, adjustment of the mA and/or kV according to patient size and/or use of iterative reconstruction technique. COMPARISON:  01/10/2021 FINDINGS: Lower chest: No pleural or pericardial effusion. Stable 3 mm pleural-based nodule, lateral basal segment left lower lobe; no follow-up indicated. Hepatobiliary: No focal liver abnormality is seen. No gallstones, gallbladder wall thickening, or biliary dilatation. Pancreas: Unremarkable. No pancreatic ductal dilatation or surrounding inflammatory changes. Spleen: Normal in size without focal abnormality. Adrenals/Urinary Tract: Adrenal glands are unremarkable. Kidneys are normal, without renal calculi, focal lesion, or hydronephrosis. Bladder is unremarkable. Stomach/Bowel: Stomach is incompletely distended, unremarkable. Small bowel decompressed. Normal appendix. The colon is incompletely distended, unremarkable. Vascular/Lymphatic: Mild scattered aortoiliac calcified atheromatous plaque without aneurysm. Retroaortic left renal vein, an anatomic variant. No abdominal or pelvic adenopathy. Reproductive: Uterus and bilateral adnexa are unremarkable. Other: Bilateral pelvic phleboliths.   No ascites.  No free air. Musculoskeletal: No acute or significant osseous findings. IMPRESSION: 1. No acute findings. No urolithiasis or hydronephrosis. 2.  Aortic Atherosclerosis (ICD10-170.0). Electronically Signed   By: Lucrezia Europe M.D.   On: 11/14/2021 16:31    Procedures Procedures    Medications Ordered in ED Medications  ibuprofen (ADVIL) tablet 600 mg (has no administration in time range)    ED Course/ Medical Decision Making/ A&P                           Medical Decision Making  This patient presents to the ED for concern of back pain and hematuria, this involves a number of treatment options, and is a complaint that carries with it a high risk of complications and morbidity.  The differential diagnosis includes renal stone, pyelonephritis, cauda equina syndrome, lumbar fracture and/or stenosis.   Co morbidities: Discussed in HPI    EMR reviewed including pt PMHx, past surgical history  and past visits to ER.   See HPI for more details   Imaging Studies:  NAD. I personally reviewed all imaging studies and no acute abnormality found. I agree with radiology interpretation.    Cardiac Monitoring:  The patient was maintained on a cardiac monitor.  I personally viewed and interpreted the cardiac monitored which showed an underlying rhythm of: NSR EKG non-ischemic   Medicines ordered:  I ordered medication including ibuprofen for lower back pain. Reevaluation of the patient after these medicines showed that the patient stayed the same I have reviewed the patients home medicines and have made adjustments as needed   Consults/Attending Physician   I discussed this case with my attending physician who cosigned this note including patient's presenting symptoms, physical exam, and planned diagnostics and interventions. Attending physician stated agreement with plan or made changes to plan which were implemented.   Reevaluation:  After the interventions noted  above I re-evaluated patient and found that they have :stayed the same    Problem List / ED Course: Patient presented for lower back pain and concern for possible kidney stone.  Exam did not reveal concern for CVA tenderness, normal vitals, tenderness upon back flexion.  UA did not reveal evidence of hematuria or ongoing urinary tract infection.  Considered renal stone but unlikely given CT scan also was negative for renal stone.  Considered cauda equina syndrome but unlikely given no associated symptoms.  Considered fracture and/or stenosis of the lumbar spine but unlikely given no trauma no midline tenderness.  Symptoms are likely related to MSK injury of the lower back.  Recommended ibuprofen and Tylenol as needed for pain and follow-up with PCP regarding ongoing lower back pain.  Dispostion:  After consideration of the diagnostic results and the patients response to treatment, I feel that the patent would benefit from discharge home and follow-up with PCP regarding ongoing lower back pain.         Final Clinical Impression(s) / ED Diagnoses Final diagnoses:  Acute low back pain, unspecified back pain laterality, unspecified whether sciatica present    Rx / DC Orders ED Discharge Orders     None         Lindell Spar 11/14/21 2009    Carmin Muskrat, MD 11/14/21 2342

## 2021-11-14 NOTE — Discharge Instructions (Addendum)
Evaluation for lower back pain and possible kidney stone was overall reassuring.  Urinalysis did not reveal bloody urine and CT scan was negative for kidney stone making it very unlikely that you have a kidney stone.  Lower back pain is likely musculoskeletal injury of some kind.  Recommend that he take Tylenol and ibuprofen as needed for pain and follow-up with your PCP regarding ongoing acute lower back pain.  If you have new numbness or tingling in your lower extremities, midline spinal tenderness, urinary or bowel incontinence, new fever please return to the emergency department for further evaluation.

## 2021-11-14 NOTE — ED Provider Triage Note (Signed)
Emergency Medicine Provider Triage Evaluation Note  Rachel Bowen , a 56 y.o. female  was evaluated in triage.  Pt complains of back and flank pain.  Sent to ED for further evaluation from PCP office.  Patient has had low back pain that radiates into her lower abdomen worse on the left than right for the past 3 days.  Was seen at urgent care yesterday at that time had normal urine and was sent home with treatment for musculoskeletal back pain.  Followed up with PCP today and she had trace blood in her urine, sent to the ED for further evaluation for possible kidney stone.  Review of Systems  Positive: Back pain, flank pain, abdominal pain Negative: Fevers, chills, nausea, vomiting, dysuria, hematuria  Physical Exam  Ht 5' 2"  (1.575 m)   Wt 75 kg   LMP 07/02/2012   BMI 30.24 kg/m  Gen:   Awake, no distress   Resp:  Normal effort  MSK:   Moves extremities without difficulty  Other:  Tenderness over the left flank, left low back and lower abdomen, very mild tenderness noted right.  Medical Decision Making  Medically screening exam initiated at 4:12 PM.  Appropriate orders placed.  Rachel Bowen was informed that the remainder of the evaluation will be completed by another provider, this initial triage assessment does not replace that evaluation, and the importance of remaining in the ED until their evaluation is complete.  Labs ordered as well as CT renal stone study.   Rachel Bowen, [place] 11/14/21 1621

## 2021-11-14 NOTE — ED Triage Notes (Signed)
Pt reports lower back pain radiating to abd x3 days.  Prescribed muscle relaxer's at [facility] yesterday with relief.  Reports blood in urinalysis.

## 2021-11-26 DIAGNOSIS — E230 Hypopituitarism: Secondary | ICD-10-CM | POA: Diagnosis not present

## 2021-11-26 DIAGNOSIS — D352 Benign neoplasm of pituitary gland: Secondary | ICD-10-CM | POA: Diagnosis not present

## 2021-11-26 DIAGNOSIS — E038 Other specified hypothyroidism: Secondary | ICD-10-CM | POA: Diagnosis not present

## 2021-11-26 DIAGNOSIS — Z79899 Other long term (current) drug therapy: Secondary | ICD-10-CM | POA: Diagnosis not present

## 2021-11-29 DIAGNOSIS — M549 Dorsalgia, unspecified: Secondary | ICD-10-CM | POA: Diagnosis not present

## 2021-11-29 DIAGNOSIS — E119 Type 2 diabetes mellitus without complications: Secondary | ICD-10-CM | POA: Diagnosis not present

## 2021-11-29 DIAGNOSIS — E039 Hypothyroidism, unspecified: Secondary | ICD-10-CM | POA: Diagnosis not present

## 2021-11-29 DIAGNOSIS — D352 Benign neoplasm of pituitary gland: Secondary | ICD-10-CM | POA: Diagnosis not present

## 2021-11-29 DIAGNOSIS — E230 Hypopituitarism: Secondary | ICD-10-CM | POA: Diagnosis not present

## 2021-11-29 DIAGNOSIS — E232 Diabetes insipidus: Secondary | ICD-10-CM | POA: Diagnosis not present

## 2021-11-29 DIAGNOSIS — E038 Other specified hypothyroidism: Secondary | ICD-10-CM | POA: Diagnosis not present

## 2021-12-14 DIAGNOSIS — Z79899 Other long term (current) drug therapy: Secondary | ICD-10-CM | POA: Diagnosis not present

## 2021-12-14 DIAGNOSIS — D497 Neoplasm of unspecified behavior of endocrine glands and other parts of nervous system: Secondary | ICD-10-CM | POA: Diagnosis not present

## 2022-02-05 DIAGNOSIS — E1165 Type 2 diabetes mellitus with hyperglycemia: Secondary | ICD-10-CM | POA: Diagnosis not present

## 2022-02-05 DIAGNOSIS — E038 Other specified hypothyroidism: Secondary | ICD-10-CM | POA: Insufficient documentation

## 2022-02-05 DIAGNOSIS — E230 Hypopituitarism: Secondary | ICD-10-CM | POA: Insufficient documentation

## 2022-02-05 DIAGNOSIS — A6000 Herpesviral infection of urogenital system, unspecified: Secondary | ICD-10-CM | POA: Insufficient documentation

## 2022-02-05 DIAGNOSIS — F32A Depression, unspecified: Secondary | ICD-10-CM | POA: Diagnosis not present

## 2022-02-05 DIAGNOSIS — Z86012 Personal history of benign carcinoid tumor: Secondary | ICD-10-CM | POA: Diagnosis not present

## 2022-02-05 DIAGNOSIS — F419 Anxiety disorder, unspecified: Secondary | ICD-10-CM | POA: Insufficient documentation

## 2022-02-05 DIAGNOSIS — E232 Diabetes insipidus: Secondary | ICD-10-CM | POA: Diagnosis not present

## 2022-02-05 DIAGNOSIS — D352 Benign neoplasm of pituitary gland: Secondary | ICD-10-CM | POA: Insufficient documentation

## 2022-02-05 DIAGNOSIS — G4700 Insomnia, unspecified: Secondary | ICD-10-CM | POA: Diagnosis not present

## 2022-02-15 ENCOUNTER — Ambulatory Visit
Admission: RE | Admit: 2022-02-15 | Discharge: 2022-02-15 | Disposition: A | Discharge status: Home / Self Care | Payer: Medicare Other | Source: Ambulatory Visit | Attending: Family Medicine | Admitting: Family Medicine

## 2022-02-15 DIAGNOSIS — Z780 Asymptomatic menopausal state: Secondary | ICD-10-CM | POA: Diagnosis not present

## 2022-02-15 DIAGNOSIS — E230 Hypopituitarism: Secondary | ICD-10-CM

## 2022-02-15 DIAGNOSIS — M8589 Other specified disorders of bone density and structure, multiple sites: Secondary | ICD-10-CM | POA: Diagnosis not present

## 2022-02-15 DIAGNOSIS — E2839 Other primary ovarian failure: Secondary | ICD-10-CM

## 2022-03-01 DIAGNOSIS — E232 Diabetes insipidus: Secondary | ICD-10-CM | POA: Diagnosis not present

## 2022-06-13 ENCOUNTER — Other Ambulatory Visit: Payer: Self-pay | Admitting: Internal Medicine

## 2022-06-13 DIAGNOSIS — R0602 Shortness of breath: Secondary | ICD-10-CM

## 2022-06-13 DIAGNOSIS — R0789 Other chest pain: Secondary | ICD-10-CM

## 2022-06-19 ENCOUNTER — Encounter (HOSPITAL_COMMUNITY): Payer: Self-pay

## 2022-06-19 ENCOUNTER — Other Ambulatory Visit (HOSPITAL_COMMUNITY): Payer: Self-pay | Admitting: *Deleted

## 2022-06-19 ENCOUNTER — Telehealth (HOSPITAL_COMMUNITY): Payer: Self-pay | Admitting: *Deleted

## 2022-06-19 ENCOUNTER — Telehealth (HOSPITAL_COMMUNITY): Payer: Self-pay | Admitting: Emergency Medicine

## 2022-06-19 MED ORDER — METOPROLOL TARTRATE 100 MG PO TABS: ORAL_TABLET | ORAL | 0 refills | Status: AC

## 2022-06-19 NOTE — Telephone Encounter (Signed)
Patient returning call about upcoming cardiac imaging study; pt verbalizes understanding of appt date/time, parking situation and where to check in, pre-test NPO status and medications ordered, and verified current allergies; name and call back number provided for further questions should they arise  Larey Brick RN Navigator Cardiac Imaging [facility] [phone] office [phone] cell  Patient to take 100mg  metoprolol tartrate two hours prior to her cardiac CT scan.  She is aware to arrive at12:30pm.

## 2022-06-19 NOTE — Telephone Encounter (Signed)
Attempted to call patient regarding upcoming cardiac CT appointment. °Left message on voicemail with name and callback number ° °Kimetha Trulson RN Navigator Cardiac Imaging °[facility] °[phone] Office °[phone] Cell ° °

## 2022-06-19 NOTE — Telephone Encounter (Signed)
Unable to leave vm Kura Bethards RN Navigator Cardiac Imaging [facility] [phone] Office  [phone] Cell  

## 2022-06-20 ENCOUNTER — Ambulatory Visit (HOSPITAL_COMMUNITY): Admission: RE | Admit: 2022-06-20 | Payer: Medicare HMO | Source: Ambulatory Visit

## 2022-06-20 ENCOUNTER — Encounter (HOSPITAL_COMMUNITY): Payer: Self-pay

## 2022-06-26 ENCOUNTER — Telehealth (HOSPITAL_COMMUNITY): Payer: Self-pay | Admitting: *Deleted

## 2022-06-26 NOTE — Telephone Encounter (Signed)
Attempted to call patient regarding upcoming cardiac CT appointment. °Left message on voicemail with name and callback number ° °Eddrick Dilone RN Navigator Cardiac Imaging °[facility] °[phone] Office °[phone] Cell ° °

## 2022-06-26 NOTE — Telephone Encounter (Signed)
Reaching out to patient to offer assistance regarding upcoming cardiac imaging study; pt verbalizes understanding of appt date/time, parking situation and where to check in, pre-test NPO status and medications ordered, and verified current allergies; name and call back number provided for further questions should they arise  Hunter Pinkard RN Navigator Cardiac Imaging  [facility] [phone] office [phone] cell  Patient to take 100mg metoprolol tartrate two hours prior to her cardiac CT scan. She is aware to arrive at 9am. 

## 2022-06-27 ENCOUNTER — Ambulatory Visit (HOSPITAL_COMMUNITY)
Admission: RE | Admit: 2022-06-27 | Discharge: 2022-06-27 | Disposition: A | Discharge status: Home / Self Care | Payer: Medicare HMO | Source: Ambulatory Visit | Attending: Internal Medicine | Admitting: Internal Medicine

## 2022-06-27 VITALS — BP 105/72 | HR 69

## 2022-06-27 DIAGNOSIS — R072 Precordial pain: Secondary | ICD-10-CM

## 2022-06-27 DIAGNOSIS — R7303 Prediabetes: Secondary | ICD-10-CM | POA: Diagnosis not present

## 2022-06-27 DIAGNOSIS — R0789 Other chest pain: Secondary | ICD-10-CM | POA: Insufficient documentation

## 2022-06-27 DIAGNOSIS — R0602 Shortness of breath: Secondary | ICD-10-CM | POA: Insufficient documentation

## 2022-06-27 DIAGNOSIS — Z136 Encounter for screening for cardiovascular disorders: Secondary | ICD-10-CM | POA: Diagnosis not present

## 2022-06-27 LAB — POCT I-STAT CREATININE: Creatinine, Ser: 0.8 mg/dL (ref 0.44–1.00)

## 2022-06-27 MED ORDER — NITROGLYCERIN 0.4 MG SL SUBL
SUBLINGUAL_TABLET | SUBLINGUAL | Status: AC
  Filled 2022-06-27: qty 2

## 2022-06-27 MED ORDER — IOHEXOL 350 MG/ML SOLN
95.0000 mL | Freq: Once | INTRAVENOUS | Status: AC | PRN
  Administered 2022-06-27: 95 mL via INTRAVENOUS

## 2022-06-27 MED ORDER — NITROGLYCERIN 0.4 MG SL SUBL
0.8000 mg | SUBLINGUAL_TABLET | Freq: Once | SUBLINGUAL | Status: AC
  Administered 2022-06-27: 0.8 mg via SUBLINGUAL

## 2022-08-08 ENCOUNTER — Other Ambulatory Visit: Payer: Self-pay | Admitting: Student

## 2022-08-08 DIAGNOSIS — Z1231 Encounter for screening mammogram for malignant neoplasm of breast: Secondary | ICD-10-CM

## 2022-08-14 ENCOUNTER — Other Ambulatory Visit: Payer: Self-pay | Admitting: Student

## 2022-08-14 DIAGNOSIS — Z1231 Encounter for screening mammogram for malignant neoplasm of breast: Secondary | ICD-10-CM

## 2022-08-15 ENCOUNTER — Ambulatory Visit: Payer: Medicare HMO

## 2022-08-23 ENCOUNTER — Ambulatory Visit
Admission: RE | Admit: 2022-08-23 | Discharge: 2022-08-23 | Disposition: A | Discharge status: Home / Self Care | Payer: Medicare HMO | Source: Ambulatory Visit | Attending: Student | Admitting: Student

## 2022-08-23 DIAGNOSIS — Z1231 Encounter for screening mammogram for malignant neoplasm of breast: Secondary | ICD-10-CM

## 2022-09-16 ENCOUNTER — Ambulatory Visit
Admission: EM | Admit: 2022-09-16 | Discharge: 2022-09-16 | Disposition: A | Discharge status: Home / Self Care | Payer: Medicare HMO

## 2022-09-16 ENCOUNTER — Encounter: Payer: Self-pay | Admitting: *Deleted

## 2022-09-16 ENCOUNTER — Other Ambulatory Visit: Payer: Self-pay

## 2022-09-16 ENCOUNTER — Emergency Department (HOSPITAL_BASED_OUTPATIENT_CLINIC_OR_DEPARTMENT_OTHER)
Admission: EM | Admit: 2022-09-16 | Discharge: 2022-09-16 | Discharge status: Against Medical Advice | Payer: Medicare HMO | Attending: Emergency Medicine | Admitting: Emergency Medicine

## 2022-09-16 DIAGNOSIS — R519 Headache, unspecified: Secondary | ICD-10-CM | POA: Insufficient documentation

## 2022-09-16 DIAGNOSIS — H538 Other visual disturbances: Secondary | ICD-10-CM | POA: Insufficient documentation

## 2022-09-16 DIAGNOSIS — T6591XA Toxic effect of unspecified substance, accidental (unintentional), initial encounter: Secondary | ICD-10-CM

## 2022-09-16 DIAGNOSIS — Z5321 Procedure and treatment not carried out due to patient leaving prior to being seen by health care provider: Secondary | ICD-10-CM | POA: Diagnosis not present

## 2022-09-16 DIAGNOSIS — R110 Nausea: Secondary | ICD-10-CM | POA: Insufficient documentation

## 2022-09-16 DIAGNOSIS — R42 Dizziness and giddiness: Secondary | ICD-10-CM | POA: Insufficient documentation

## 2022-09-16 HISTORY — DX: Benign neoplasm of pituitary gland: D35.2

## 2022-09-16 HISTORY — DX: Unspecified adrenocortical insufficiency: E27.40

## 2022-09-16 NOTE — ED Triage Notes (Signed)
Pt called x1 for room, no response.

## 2022-09-16 NOTE — ED Notes (Signed)
No answer x 1 from lobby

## 2022-09-16 NOTE — ED Notes (Signed)
Patient is being discharged from the Urgent Care and sent to the Emergency Department via private vehicle . Per R. Izola Price, [place], patient is in need of higher level of care due to possible ingestion of unk substance. Patient is aware and verbalizes understanding of plan of care.  Vitals:   09/16/22 1756  BP: 132/82  Pulse: 62  Resp: 20  Temp: 98.1 F (36.7 C)  SpO2: 98%

## 2022-09-16 NOTE — ED Provider Notes (Signed)
Patient here today for concerns for possible ingestion of harmful substance in [employer] hashbrown. Recommended further evaluation in the ED for stat labs. Patient is agreeable to same.    Tomi Bamberger, PA-C 09/16/22 [phone]

## 2022-09-16 NOTE — ED Triage Notes (Signed)
Pt states that she was eating a hashbrown at [employer] this morning and after one bite, she had sudden onset of dizziness, blurred vision, headache, and nausea that lasted approx 30 mins ago. Pt went to UC and they sent her here for further testing to rule out food contamination. Denies abdominal pain; vomiting and/or diarrhea

## 2022-09-16 NOTE — Discharge Instructions (Signed)
  Please report to [facility] [place]  [address] [place], [place] [address]   

## 2022-09-16 NOTE — ED Triage Notes (Addendum)
Pt reports biting down into a hashbrown she picked up at a restaurant around noon today, when she had sudden onset of "Burning sensation in mouth; I swallowed one piece, I got real sick, my vision became blurred and got a HA". States blurred vision has resolved, but continues with HA and general malaise. Denies vomiting or diarrhea, but c/o some nausea.

## 2022-10-20 ENCOUNTER — Other Ambulatory Visit: Payer: Self-pay

## 2022-10-20 ENCOUNTER — Emergency Department (HOSPITAL_COMMUNITY)
Admission: EM | Admit: 2022-10-20 | Discharge: 2022-10-20 | Disposition: A | Discharge status: Home / Self Care | Payer: Medicare HMO | Attending: Emergency Medicine | Admitting: Emergency Medicine

## 2022-10-20 DIAGNOSIS — R110 Nausea: Secondary | ICD-10-CM | POA: Diagnosis present

## 2022-10-20 DIAGNOSIS — R42 Dizziness and giddiness: Secondary | ICD-10-CM | POA: Diagnosis not present

## 2022-10-20 LAB — COMPREHENSIVE METABOLIC PANEL
ALT: 19 U/L (ref 0–44)
AST: 19 U/L (ref 15–41)
Albumin: 3.8 g/dL (ref 3.5–5.0)
Alkaline Phosphatase: 64 U/L (ref 38–126)
Anion gap: 8 (ref 5–15)
BUN: 15 mg/dL (ref 6–20)
CO2: 26 mmol/L (ref 22–32)
Calcium: 8.7 mg/dL — ABNORMAL LOW (ref 8.9–10.3)
Chloride: 97 mmol/L — ABNORMAL LOW (ref 98–111)
Creatinine, Ser: 0.69 mg/dL (ref 0.44–1.00)
GFR, Estimated: >60 mL/min (ref >60)
Glucose, Bld: 106 mg/dL — ABNORMAL HIGH (ref 70–99)
Potassium: 4 mmol/L (ref 3.5–5.1)
Sodium: 131 mmol/L — ABNORMAL LOW (ref 135–145)
Total Bilirubin: 0.4 mg/dL (ref 0.3–1.2)
Total Protein: 7 g/dL (ref 6.5–8.1)

## 2022-10-20 LAB — URINALYSIS, ROUTINE W REFLEX MICROSCOPIC
Bacteria, UA: NONE SEEN
Bilirubin Urine: NEGATIVE
Glucose, UA: NEGATIVE mg/dL
Hgb urine dipstick: NEGATIVE
Ketones, ur: NEGATIVE mg/dL
Nitrite: NEGATIVE
Protein, ur: NEGATIVE mg/dL
Specific Gravity, Urine: 1.018 (ref 1.005–1.030)
pH: 6 (ref 5.0–8.0)

## 2022-10-20 LAB — CBC WITH DIFFERENTIAL/PLATELET
Abs Immature Granulocytes: 0.04 10*3/uL (ref 0.00–0.07)
Basophils Absolute: 0 10*3/uL (ref 0.0–0.1)
Basophils Relative: 1 %
Eosinophils Absolute: 0 10*3/uL (ref 0.0–0.5)
Eosinophils Relative: 0 %
HCT: 39.8 % (ref 36.0–46.0)
Hemoglobin: 13.2 g/dL (ref 12.0–15.0)
Immature Granulocytes: 1 %
Lymphocytes Relative: 33 %
Lymphs Abs: 2.5 10*3/uL (ref 0.7–4.0)
MCH: 29.6 pg (ref 26.0–34.0)
MCHC: 33.2 g/dL (ref 30.0–36.0)
MCV: 89.2 fL (ref 80.0–100.0)
Monocytes Absolute: 0.6 10*3/uL (ref 0.1–1.0)
Monocytes Relative: 8 %
Neutro Abs: 4.6 10*3/uL (ref 1.7–7.7)
Neutrophils Relative %: 57 %
Platelets: 212 10*3/uL (ref 150–400)
RBC: 4.46 MIL/uL (ref 3.87–5.11)
RDW: 13.3 % (ref 11.5–15.5)
WBC: 7.8 10*3/uL (ref 4.0–10.5)
nRBC: 0 % (ref 0.0–0.2)

## 2022-10-20 LAB — CBG MONITORING, ED
Glucose-Capillary: 68 mg/dL — ABNORMAL LOW (ref 70–99)
Glucose-Capillary: 81 mg/dL (ref 70–99)

## 2022-10-20 MED ORDER — ONDANSETRON 8 MG PO TBDP
8.0000 mg | ORAL_TABLET | Freq: Once | ORAL | Status: AC
  Administered 2022-10-20: 8 mg via ORAL
  Filled 2022-10-20: qty 1

## 2022-10-20 NOTE — ED Provider Triage Note (Signed)
Emergency Medicine Provider Triage Evaluation Note  Rachel Bowen , a 57 y.o. female  was evaluated in triage.  Pt complains of nausea   Review of Systems  Positive: nausea Negative: rash  Physical Exam  BP (!) 171/83   Pulse 61   Temp 98 F (36.7 C) (Oral)   Resp 16   LMP 07/02/2012   SpO2 100%  Gen:   Awake, no distress   Resp:  Normal effort  MSK:   Moves extremities without difficulty  Other:  RRR  Medical Decision Making  Medically screening exam initiated at 5:51 AM.  Appropriate orders placed.  Rachel Bowen was informed that the remainder of the evaluation will be completed by another provider, this initial triage assessment does not replace that evaluation, and the importance of remaining in the ED until their evaluation is complete.     Rachel Treaster, MD 10/20/22 [phone]

## 2022-10-20 NOTE — Discharge Instructions (Addendum)
You were seen in the ER for nausea and dizziness.  As we discussed, your lab work was reassuring. Please follow up with your endocrinologist regarding your medications. I think you should also discuss possibly starting blood pressure medication with your primary doctor.   I'm glad that the medication we gave you seemed to help your nausea. I recommend staying well hydrated at home and eating more bland food until your stomach can tolerate more.   Continue to monitor how you're doing and return to the ER for new or worsening symptoms.

## 2022-10-20 NOTE — ED Triage Notes (Signed)
Pt BIB [facility] from home. Pt c/o nausea and dizziness that started at 2am. Pt Hx adrenal insufficiency.  170/90 84HR 98% RA 93 CBG

## 2022-10-20 NOTE — ED Provider Notes (Signed)
[facility] AT [facility] Provider Note   CSN: [id number] Arrival date & time: 10/20/22  0444     History  Chief Complaint  Patient presents with   Nausea    Rachel Bowen is a 57 y.o. female with history of adrenal insufficiency, hyperlipidemia, pituitary adenoma s/p removal in 2021, prediabetes, who presents the emergency department complaining of nausea.  Patient states that she has been having some intermittent dizziness and nausea for the past several weeks.  This started getting worse last night.  She was told by her endocrinologist to come to the ER if she has episodes like this to make sure that her labs are normal.  She denies any fever or chills.  Denies any pain.  She is already feeling improved after medications given during triage.  HPI     Home Medications Prior to Admission medications   Medication Sig Start Date End Date Taking? Authorizing Provider  cyclobenzaprine (FLEXERIL) 10 MG tablet Take 1 tablet (10 mg total) by mouth 2 (two) times daily as needed for muscle spasms. 11/13/21   Tomi Bamberger, PA-C  desmopressin (DDAVP) 0.1 MG tablet Take by mouth. 07/02/21   [provider]  dexamethasone (DECADRON) 2 MG tablet Take 1 tablet (2 mg total) by mouth daily. 05/31/19   Vaslow, Georgeanna Lea, MD  dexamethasone (DECADRON) 4 MG tablet dexamethasone 4 mg tablet  TAKE 1 TABLET (4 MG TOTAL) BY MOUTH DAILY.    [provider]  escitalopram (LEXAPRO) 10 MG tablet Take 10 mg by mouth daily. 06/11/21   [provider]  ferrous sulfate 325 (65 FE) MG tablet Take 325 mg by mouth daily. 04/20/19 05/20/19  [provider]  hydrocortisone (CORTEF) 10 MG tablet Hydrocortisone 10 mg am and half pill (5 mg) at lunch and 5 mg 4 pm 05/01/21   [provider]  hydrOXYzine (ATARAX) 10 MG tablet Take 10 mg by mouth at bedtime as needed. 06/11/21   [provider]  levothyroxine (SYNTHROID) 50 MCG tablet Take by  mouth. 06/11/21   [provider]  LORazepam (ATIVAN) 0.5 MG tablet Take one tablet by mouth one hour prior to MRI. May repeat after 30 minutes if needed for anxiety 05/14/21   [provider]  metoprolol tartrate (LOPRESSOR) 100 MG tablet Take tablet TWO hours prior to your cardiac CT scan. 06/19/22   Sande Rives, MD  pantoprazole (PROTONIX) 40 MG tablet Take 40 mg by mouth in the morning and at bedtime. x14 days 04/18/19 05/02/19  [provider]      Allergies    Amoxicillin    Review of Systems   Review of Systems  Respiratory:  Negative for shortness of breath.   Cardiovascular:  Negative for chest pain, palpitations and leg swelling.  Gastrointestinal:  Positive for nausea. Negative for abdominal pain.  Neurological:  Positive for dizziness.  All other systems reviewed and are negative.   Physical Exam Updated Vital Signs BP (!) 166/94 Comment: nurse notified  Pulse (!) 58   Temp 97.7 F (36.5 C) (Oral)   Resp 17   LMP 07/02/2012   SpO2 100%  Physical Exam Vitals and nursing note reviewed.  Constitutional:      Appearance: Normal appearance.  HENT:     Head: Normocephalic and atraumatic.  Eyes:     Conjunctiva/sclera: Conjunctivae normal.  Cardiovascular:     Rate and Rhythm: Normal rate and regular rhythm.  Pulmonary:     Effort: Pulmonary  effort is normal. No respiratory distress.     Breath sounds: Normal breath sounds.  Abdominal:     General: There is no distension.     Palpations: Abdomen is soft.     Tenderness: There is no abdominal tenderness.  Skin:    General: Skin is warm and dry.  Neurological:     General: No focal deficit present.     Mental Status: She is alert.     ED Results / Procedures / Treatments   Labs (all labs ordered are listed, but only abnormal results are displayed) Labs Reviewed  COMPREHENSIVE METABOLIC PANEL - Abnormal; Notable for the following components:      Result Value   Sodium 131 (*)     Chloride 97 (*)    Glucose, Bld 106 (*)    Calcium 8.7 (*)    All other components within normal limits  URINALYSIS, ROUTINE W REFLEX MICROSCOPIC - Abnormal; Notable for the following components:   APPearance HAZY (*)    Leukocytes,Ua TRACE (*)    All other components within normal limits  CBG MONITORING, ED - Abnormal; Notable for the following components:   Glucose-Capillary 68 (*)    All other components within normal limits  CBC WITH DIFFERENTIAL/PLATELET  CBG MONITORING, ED    EKG None  Radiology No results found.  Procedures Procedures    Medications Ordered in ED Medications  ondansetron (ZOFRAN-ODT) disintegrating tablet 8 mg (8 mg Oral Given 10/20/22 0555)    ED Course/ Medical Decision Making/ A&P                                 Medical Decision Making Amount and/or Complexity of Data Reviewed Labs: ordered.  Risk Prescription drug management.   This patient is a 57 y.o. female  who presents to the ED for concern of nausea, dizziness.   Differential diagnoses prior to evaluation: The emergent differential diagnosis includes, but is not limited to,  adrenal crisis, or insufficiency, DKA, elevated ICP, Sepsis, Drug-related (toxicity, THC hyperemesis, ETOH, withdrawal), Electrolyte abnormalities, Gastroenteritis, UTI. This is not an exhaustive differential.   Past Medical History / Co-morbidities / Social History: adrenal insufficiency, hyperlipidemia, pituitary adenoma s/p removal in 2021, prediabetes  Additional history: Chart reviewed. Pertinent results include: Patient follows with [facility] endocrinology.  Reviewed most recent visit note and telephone encounter.  Endocrinologist had recommended on 8/30 that patient continue her double dose of steroids, and see how she feels at the end of the weekend.  She is supposed to follow-up with them sometime next week.  Physical Exam: Physical exam performed. The pertinent findings include: Hypertensive, otherwise  normal vital signs.  Nausea has improved.  Not complaining of any pain.  Ambulating without significant difficulty.  Lab Tests/Imaging studies: I personally interpreted labs/imaging and the pertinent results include: CBC unremarkable.  CMP with potassium of 131, otherwise unremarkable.  Urinalysis negative.  As patient's had improvement of symptoms, is able to ambulate, and has no focal findings, low concern for CVA or TIA as cause of dizziness.  Do not feel she is requiring emergent brain imaging at this time.  Medications: Zofran was ordered in triage.  I have reviewed the patients home medicines and have made adjustments as needed.   Disposition: After consideration of the diagnostic results and the patients response to treatment, I feel that emergency department workup does not suggest an emergent condition requiring admission or immediate intervention beyond  what has been performed at this time. The plan is: Discharge to home with mentation for endocrinology follow-up.  No evidence of adrenal insufficiency.  No emergent etiology found for symptoms today.  I am reassured that patient is feeling much better after medications given.  She feels comfortable with discharge to home, and will follow-up with her endocrinologist and PCP about ongoing symptoms.  I did also recommend she discuss with them her elevated blood pressure readings today.  Not at the level of hypertensive urgency or emergency, but could benefit from starting antihypertensives outpatient. The patient is safe for discharge and has been instructed to return immediately for worsening symptoms, change in symptoms or any other concerns.  Final Clinical Impression(s) / ED Diagnoses Final diagnoses:  Nausea  Dizziness    Rx / DC Orders ED Discharge Orders     None      Portions of this report may have been transcribed using voice recognition software. Every effort was made to ensure accuracy; however, inadvertent computerized  transcription errors may be present.    Jeanella Flattery 10/20/22 1118    Alvira Monday, MD 10/21/22 1123

## 2023-06-04 ENCOUNTER — Other Ambulatory Visit: Payer: Self-pay | Admitting: Internal Medicine

## 2023-06-04 DIAGNOSIS — G44201 Tension-type headache, unspecified, intractable: Secondary | ICD-10-CM

## 2023-06-04 DIAGNOSIS — E230 Hypopituitarism: Secondary | ICD-10-CM

## 2023-06-12 ENCOUNTER — Ambulatory Visit
Admission: RE | Admit: 2023-06-12 | Discharge: 2023-06-12 | Disposition: A | Discharge status: Home / Self Care | Source: Ambulatory Visit | Attending: Internal Medicine | Admitting: Internal Medicine

## 2023-06-12 DIAGNOSIS — G44201 Tension-type headache, unspecified, intractable: Secondary | ICD-10-CM | POA: Diagnosis present

## 2023-06-12 DIAGNOSIS — E230 Hypopituitarism: Secondary | ICD-10-CM | POA: Diagnosis present

## 2023-06-25 ENCOUNTER — Emergency Department (HOSPITAL_BASED_OUTPATIENT_CLINIC_OR_DEPARTMENT_OTHER)

## 2023-06-25 ENCOUNTER — Emergency Department (HOSPITAL_BASED_OUTPATIENT_CLINIC_OR_DEPARTMENT_OTHER)
Admission: EM | Admit: 2023-06-25 | Discharge: 2023-06-25 | Disposition: A | Discharge status: Home / Self Care | Attending: Emergency Medicine | Admitting: Emergency Medicine

## 2023-06-25 ENCOUNTER — Other Ambulatory Visit (HOSPITAL_BASED_OUTPATIENT_CLINIC_OR_DEPARTMENT_OTHER): Payer: Self-pay

## 2023-06-25 ENCOUNTER — Other Ambulatory Visit: Payer: Self-pay

## 2023-06-25 ENCOUNTER — Encounter (HOSPITAL_BASED_OUTPATIENT_CLINIC_OR_DEPARTMENT_OTHER): Payer: Self-pay | Admitting: Emergency Medicine

## 2023-06-25 ENCOUNTER — Ambulatory Visit: Admission: EM | Admit: 2023-06-25 | Discharge: 2023-06-25 | Disposition: A | Discharge status: Home / Self Care

## 2023-06-25 DIAGNOSIS — U071 COVID-19: Secondary | ICD-10-CM | POA: Insufficient documentation

## 2023-06-25 DIAGNOSIS — R509 Fever, unspecified: Secondary | ICD-10-CM

## 2023-06-25 DIAGNOSIS — R059 Cough, unspecified: Secondary | ICD-10-CM | POA: Diagnosis present

## 2023-06-25 DIAGNOSIS — N179 Acute kidney failure, unspecified: Secondary | ICD-10-CM | POA: Insufficient documentation

## 2023-06-25 DIAGNOSIS — E119 Type 2 diabetes mellitus without complications: Secondary | ICD-10-CM | POA: Diagnosis not present

## 2023-06-25 DIAGNOSIS — Z79899 Other long term (current) drug therapy: Secondary | ICD-10-CM | POA: Insufficient documentation

## 2023-06-25 DIAGNOSIS — E232 Diabetes insipidus: Secondary | ICD-10-CM | POA: Diagnosis not present

## 2023-06-25 LAB — URINALYSIS, W/ REFLEX TO CULTURE (INFECTION SUSPECTED)
Bacteria, UA: NONE SEEN
Bilirubin Urine: NEGATIVE
Glucose, UA: NEGATIVE mg/dL
Hgb urine dipstick: NEGATIVE
Ketones, ur: NEGATIVE mg/dL
Nitrite: NEGATIVE
Protein, ur: 30 mg/dL — AB
Specific Gravity, Urine: 1.03 (ref 1.005–1.030)
pH: 5.5 (ref 5.0–8.0)

## 2023-06-25 LAB — CBC WITH DIFFERENTIAL/PLATELET
Abs Immature Granulocytes: 0.03 10*3/uL (ref 0.00–0.07)
Basophils Absolute: 0 10*3/uL (ref 0.0–0.1)
Basophils Relative: 0 %
Eosinophils Absolute: 0 10*3/uL (ref 0.0–0.5)
Eosinophils Relative: 0 %
HCT: 40.8 % (ref 36.0–46.0)
Hemoglobin: 13.5 g/dL (ref 12.0–15.0)
Immature Granulocytes: 1 %
Lymphocytes Relative: 20 %
Lymphs Abs: 1.1 10*3/uL (ref 0.7–4.0)
MCH: 29.5 pg (ref 26.0–34.0)
MCHC: 33.1 g/dL (ref 30.0–36.0)
MCV: 89.3 fL (ref 80.0–100.0)
Monocytes Absolute: 0.9 10*3/uL (ref 0.1–1.0)
Monocytes Relative: 15 %
Neutro Abs: 3.6 10*3/uL (ref 1.7–7.7)
Neutrophils Relative %: 64 %
Platelets: 167 10*3/uL (ref 150–400)
RBC: 4.57 MIL/uL (ref 3.87–5.11)
RDW: 14.2 % (ref 11.5–15.5)
WBC: 5.6 10*3/uL (ref 4.0–10.5)
nRBC: 0 % (ref 0.0–0.2)

## 2023-06-25 LAB — RESP PANEL BY RT-PCR (RSV, FLU A&B, COVID)  RVPGX2
Influenza A by PCR: NEGATIVE
Influenza B by PCR: NEGATIVE
Resp Syncytial Virus by PCR: NEGATIVE
SARS Coronavirus 2 by RT PCR: POSITIVE — AB

## 2023-06-25 LAB — COMPREHENSIVE METABOLIC PANEL WITH GFR
ALT: 59 U/L — ABNORMAL HIGH (ref 0–44)
AST: 51 U/L — ABNORMAL HIGH (ref 15–41)
Albumin: 4.5 g/dL (ref 3.5–5.0)
Alkaline Phosphatase: 65 U/L (ref 38–126)
Anion gap: 14 (ref 5–15)
BUN: 12 mg/dL (ref 6–20)
CO2: 23 mmol/L (ref 22–32)
Calcium: 9.5 mg/dL (ref 8.9–10.3)
Chloride: 99 mmol/L (ref 98–111)
Creatinine, Ser: 1.09 mg/dL — ABNORMAL HIGH (ref 0.44–1.00)
GFR, Estimated: 58 mL/min — ABNORMAL LOW (ref >60)
Glucose, Bld: 98 mg/dL (ref 70–99)
Potassium: 3.8 mmol/L (ref 3.5–5.1)
Sodium: 136 mmol/L (ref 135–145)
Total Bilirubin: 0.4 mg/dL (ref 0.0–1.2)
Total Protein: 7.4 g/dL (ref 6.5–8.1)

## 2023-06-25 LAB — LACTIC ACID, PLASMA: Lactic Acid, Venous: 0.8 mmol/L (ref 0.5–1.9)

## 2023-06-25 LAB — PROTIME-INR
INR: 1 (ref 0.8–1.2)
Prothrombin Time: 13.3 s (ref 11.4–15.2)

## 2023-06-25 MED ORDER — KETOROLAC TROMETHAMINE 15 MG/ML IJ SOLN
15.0000 mg | Freq: Once | INTRAMUSCULAR | Status: AC
  Administered 2023-06-25: 15 mg via INTRAVENOUS
  Filled 2023-06-25: qty 1

## 2023-06-25 MED ORDER — LACTATED RINGERS IV BOLUS
1000.0000 mL | Freq: Once | INTRAVENOUS | Status: AC
  Administered 2023-06-25: 1000 mL via INTRAVENOUS

## 2023-06-25 MED ORDER — ONDANSETRON HCL 4 MG/2ML IJ SOLN
4.0000 mg | Freq: Once | INTRAMUSCULAR | Status: AC
  Administered 2023-06-25: 4 mg via INTRAVENOUS
  Filled 2023-06-25: qty 2

## 2023-06-25 MED ORDER — ACETAMINOPHEN 325 MG PO TABS
650.0000 mg | ORAL_TABLET | Freq: Once | ORAL | Status: AC
  Administered 2023-06-25: 650 mg via ORAL

## 2023-06-25 MED ORDER — ACETAMINOPHEN 325 MG PO TABS
650.0000 mg | ORAL_TABLET | Freq: Once | ORAL | Status: AC
  Administered 2023-06-25: 650 mg via ORAL
  Filled 2023-06-25: qty 2

## 2023-06-25 MED ORDER — ONDANSETRON 4 MG PO TBDP
4.0000 mg | ORAL_TABLET | Freq: Four times a day (QID) | ORAL | 0 refills | Status: AC | PRN
  Filled 2023-06-25: qty 15, 4d supply, fill #0

## 2023-06-25 MED ORDER — PAXLOVID (150/100) 10 X 150 MG & 10 X 100MG PO TBPK
2.0000 | ORAL_TABLET | Freq: Two times a day (BID) | ORAL | 0 refills | Status: AC
Start: 2023-06-25 — End: 2023-06-30
  Filled 2023-06-25: qty 20, 5d supply, fill #0

## 2023-06-25 NOTE — ED Triage Notes (Signed)
 Pt c/o HA, emesis x 2 days with BLE pain. Also reports cough. Referred by UC. Pt also reports fever, took tylenol  650mg 

## 2023-06-25 NOTE — Discharge Instructions (Addendum)
 Was a pleasure taking care of you today.  You are seen in the ER for fever, headache and bodyaches.  You are positive for COVID-19.  You do not have pneumonia.  You did have some dehydration so we gave you IV fluids.    Drink plenty of fluids, rest, and take the Paxlovid-which is the antiviral that can decrease risk of severe illness with COVID-19-and follow-up closely with your PCP.    Come back to the ER if you have new or worsening symptoms.    I Also prescribed Zofran  to use as needed for nausea.  You can take over-the-counter Tylenol  or ibuprofen  as needed for fever as directed on the packaging.  Your liver tests were just above the limit of normal, have this and your kidney function rechecked after you are feeling better, in about a week.

## 2023-06-25 NOTE — Discharge Instructions (Signed)
 No further emergency department that you received Tylenol  650 mg here at urgent care at 10:00 am

## 2023-06-25 NOTE — ED Triage Notes (Signed)
 Pt reports BLE aching x 2 days.  Started vomiting overnight four episodes through the night. "Intense" HA starting this morning. Pt presents fatigued and uncomfortable.

## 2023-06-25 NOTE — ED Provider Notes (Signed)
 [facility] AT [facility] Provider Note   CSN: [id number] Arrival date & time: 06/25/23  1036     History  Chief Complaint  Patient presents with   Headache    Rachel Bowen is a 58 y.o. female.  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  She presents the ER today because of bodyaches, chills, cough, sinus congestion x 3 days, worsened this morning with vomiting this morning after she tried to drink some orange juice.  She states her sister has been sick with the same symptoms and went to urgent care and was diagnosed with a viral illness.  She went to urgent care this morning herself due to worsening symptoms and was noted to have a fever of 102 and tachycardia with oxygen saturation 92% I was referred to the ED for further evaluation.  Patient denies chest pain, abdominal pain, hematemesis, diarrhea, medic easier or melena or other symptoms.  Notes she called EMS last night because of how poorly she was feeling, they came and said her vitals were normal and there is a very long wait time at the ED and advise she wait until in the morning.   Headache      Home Medications Prior to Admission medications   Medication Sig Start Date End Date Taking? Authorizing Provider  nirmatrelvir/ritonavir, renal dosing, (PAXLOVID, 150/100,) 10 x 150 MG & 10 x 100MG  TBPK Take 2 tablets by mouth 2 (two) times daily for 5 days. Dosage for moderate renal impairment (eGFR >/= 30 to <60 mL/min): 150 mg nirmatrelvir (one 150 mg tablet) with 100 mg ritonavir (one 100 mg tablet), with both tablets taken together twice daily for 5 days. Not recommended if eGFR < 30 mL/min.  PAXLOVID is not recommend in patients with severe hepatic impairment (Child-Pugh Class C). 06/25/23 06/30/23 Yes Keya Wynes A, PA-C  ondansetron  (ZOFRAN -ODT) 4 MG disintegrating tablet Take 1 tablet (4 mg total) by  mouth every 6 (six) hours as needed for nausea or vomiting. 06/25/23  Yes Daijanae Rafalski A, PA-C  cholecalciferol (VITAMIN D3) 25 MCG (1000 UNIT) tablet Take 1,000 Units by mouth daily.    [provider]  cyclobenzaprine  (FLEXERIL ) 10 MG tablet Take 1 tablet (10 mg total) by mouth 2 (two) times daily as needed for muscle spasms. 11/13/21   Vernestine Gondola, PA-C  desmopressin (DDAVP) 0.1 MG tablet Take by mouth. 07/02/21   [provider]  dexamethasone  (DECADRON ) 2 MG tablet Take 1 tablet (2 mg total) by mouth daily. 05/31/19   Vaslow, Zachary K, MD  dexamethasone  (DECADRON ) 4 MG tablet dexamethasone  4 mg tablet  TAKE 1 TABLET (4 MG TOTAL) BY MOUTH DAILY.    [provider]  escitalopram (LEXAPRO) 10 MG tablet Take 10 mg by mouth daily. 06/11/21   [provider]  ferrous sulfate 325 (65 FE) MG tablet Take 325 mg by mouth daily. 04/20/19 05/20/19  [provider]  hydrocortisone (CORTEF) 10 MG tablet Hydrocortisone 10 mg am and half pill (5 mg) at lunch and 5 mg 4 pm 05/01/21   [provider]  hydrOXYzine (ATARAX) 10 MG tablet Take 10 mg by mouth at bedtime as needed. 06/11/21   [provider]  levothyroxine (SYNTHROID) 50 MCG tablet Take by mouth. 06/11/21   [provider]  LORazepam (ATIVAN) 0.5 MG tablet Take one tablet by mouth one hour prior to MRI. May repeat after  30 minutes if needed for anxiety 05/14/21   [provider]  metoprolol  tartrate (LOPRESSOR ) 100 MG tablet Take tablet TWO hours prior to your cardiac CT scan. 06/19/22   Harrold Lincoln, MD  pantoprazole (PROTONIX) 40 MG tablet Take 40 mg by mouth in the morning and at bedtime. x14 days 04/18/19 05/02/19  [provider]      Allergies    Amoxicillin and Penicillin g    Review of Systems   Review of Systems  Neurological:  Positive for headaches.    Physical Exam Updated Vital Signs BP 114/75 (BP Location: Right Arm)   Pulse (!) 111    Temp 99.7 F (37.6 C) (Oral)   Resp 18   LMP 07/02/2012   SpO2 97%  Physical Exam Vitals and nursing note reviewed.  Constitutional:      General: She is not in acute distress.    Appearance: She is well-developed.  HENT:     Head: Normocephalic and atraumatic.  Eyes:     Extraocular Movements: Extraocular movements intact.     Conjunctiva/sclera: Conjunctivae normal.     Pupils: Pupils are equal, round, and reactive to light.  Cardiovascular:     Rate and Rhythm: Normal rate and regular rhythm.     Heart sounds: No murmur heard. Pulmonary:     Effort: Pulmonary effort is normal. No respiratory distress.     Breath sounds: Normal breath sounds.  Abdominal:     Palpations: Abdomen is soft.     Tenderness: There is no abdominal tenderness.  Musculoskeletal:        General: No swelling.     Cervical back: Normal range of motion and neck supple. No rigidity.  Lymphadenopathy:     Cervical: No cervical adenopathy.  Skin:    General: Skin is warm and dry.     Capillary Refill: Capillary refill takes less than 2 seconds.  Neurological:     Mental Status: She is alert and oriented to person, place, and time.     GCS: GCS eye subscore is 4. GCS verbal subscore is 5. GCS motor subscore is 6.     Motor: No weakness.     Gait: Gait normal.  Psychiatric:        Mood and Affect: Mood normal.        Behavior: Behavior normal.     ED Results / Procedures / Treatments   Labs (all labs ordered are listed, but only abnormal results are displayed) Labs Reviewed  RESP PANEL BY RT-PCR (RSV, FLU A&B, COVID)  RVPGX2 - Abnormal; Notable for the following components:      Result Value   SARS Coronavirus 2 by RT PCR POSITIVE (*)    All other components within normal limits  COMPREHENSIVE METABOLIC PANEL WITH GFR - Abnormal; Notable for the following components:   Creatinine, Ser 1.09 (*)    AST 51 (*)    ALT 59 (*)    GFR, Estimated 58 (*)    All other components within normal limits   URINALYSIS, W/ REFLEX TO CULTURE (INFECTION SUSPECTED) - Abnormal; Notable for the following components:   Protein, ur 30 (*)    Leukocytes,Ua SMALL (*)    All other components within normal limits  CULTURE, BLOOD (ROUTINE X 2)  CULTURE, BLOOD (ROUTINE X 2)  URINE CULTURE  LACTIC ACID, PLASMA  CBC WITH DIFFERENTIAL/PLATELET  PROTIME-INR    EKG EKG Interpretation Date/Time:  Wednesday Jun 25 2023 11:17:24 EDT Ventricular Rate:  92  PR Interval:  163 QRS Duration:  71 QT Interval:  270 QTC Calculation: 334 R Axis:   34  Text Interpretation: Sinus rhythm Borderline T abnormalities, anterior leads , new since last tracing Confirmed by Trish Furl [phone]) on 06/25/2023 11:21:44 AM  Radiology DG Chest Port 1 View Result Date: 06/25/2023 CLINICAL DATA:  Questionable sepsis - evaluate for abnormality EXAM: PORTABLE CHEST 1 VIEW COMPARISON:  None Available. FINDINGS: Bilateral lung fields are clear. Bilateral costophrenic angles are clear. Normal cardio-mediastinal silhouette. No acute osseous abnormalities. The soft tissues are within normal limits. IMPRESSION: No active disease. Electronically Signed   By: Beula Brunswick M.D.   On: 06/25/2023 11:27    Procedures Procedures    Medications Ordered in ED Medications  ondansetron  (ZOFRAN ) injection 4 mg (4 mg Intravenous Given 06/25/23 1148)  lactated ringers bolus 1,000 mL (0 mLs Intravenous Stopped 06/25/23 1252)  ketorolac (TORADOL) 15 MG/ML injection 15 mg (15 mg Intravenous Given 06/25/23 1147)  acetaminophen  (TYLENOL ) tablet 650 mg (650 mg Oral Given 06/25/23 1310)    ED Course/ Medical Decision Making/ A&P                                 Medical Decision Making This patient presents to the ED for concern of fever, cough, chills, body aches, N/V, headache this involves an extensive number of treatment options, and is a complaint that carries with it a high risk of complications and morbidity.  The differential diagnosis includes viral  illness, pneumonia, COVID-19, UTI,  gastroenteritis, sepsis, electrolyte derangement, dehydration, other   Co morbidities that complicate the patient evaluation :   panhypopituitarism, DM   Additional history obtained:  Additional history obtained from EMR External records from outside source obtained and reviewed including prior notes and labs   Lab Tests:  I Ordered, and personally interpreted labs.  The pertinent results include:   CBC-normal  CMP-mild AKI with creatinine 1.09 today, baseline between 0.7 and 0.9 approximately; mild elevation in AST and ALT 51 and 59 respectively Lactic acid normal at 0.8 INR normal Respiratory panel positive for COVID-19   Imaging Studies ordered:  I ordered imaging studies including chest x-ray which shows no pulmonary edema or infiltrate I independently visualized and interpreted imaging within scope of identifying emergent findings  I agree with the radiologist interpretation   Cardiac Monitoring: / EKG:  The patient was maintained on a cardiac monitor.  I personally viewed and interpreted the cardiac monitored which showed an underlying rhythm of: sinus rhythm      Problem List / ED Course / Critical interventions / Medication management  Fever, body aches, headache and cough-symptoms ongoing for 3 days was having vomiting today as well.  Abdomen soft and nontender, labs significant for positive COVID-19 and mild AKI and slight bump in AST and ALT at 51 and 59.  Patient is feeling much better after fluids, nausea medicine and Toradol for headache.  I discussed her findings that symptoms likely related to COVID-19 infection.  She states that her endocrinologist at [place] told her she needs to be very careful when she gets infections, discussed use of Paxlovid and she is agreeable with this. I ordered medication including toradol  for headache  Reevaluation of the patient after these medicines showed that the patient improved I have  reviewed the patients home medicines and have made adjustments as needed   Social Determinants of Health:  Pt lives independently  Amount and/or Complexity of Data Reviewed Labs: ordered. Radiology: ordered.  Risk OTC drugs. Prescription drug management.           Final Clinical Impression(s) / ED Diagnoses Final diagnoses:  COVID-19    Rx / DC Orders ED Discharge Orders          Ordered    nirmatrelvir/ritonavir, renal dosing, (PAXLOVID, 150/100,) 10 x 150 MG & 10 x 100MG  TBPK  2 times daily        06/25/23 1303    ondansetron  (ZOFRAN -ODT) 4 MG disintegrating tablet  Every 6 hours PRN        06/25/23 [address], PA-C 06/25/23 1346    Trish Furl, MD 06/25/23 1559

## 2023-06-25 NOTE — ED Provider Notes (Signed)
 [facility]    CSN: [id number] Arrival date & time: 06/25/23  0840      History   Chief Complaint Chief Complaint  Patient presents with   Leg Pain    bilateral    HPI Rachel Bowen is a 58 y.o. female.  With current medical history including diabetes insipidus, pituitary tumor, and Type 2 Diabetes Patient here for evaluation of lateral lower remedy aching and pain x 2 days.  Reports overnight developed nausea with vomiting with a mild headache which is worsened overnight.  On arrival patient has a temp of 102.9.  She was unaware that she had a fever. Patient is tachycardic and tachypneic oxygen level is 92%.  Patient reports calling the emergency room overnight due to how she was feeling and was advised to seek evaluation at an urgent care.  Patient has diabetes insipidus and per endocrinology notes patient is to be evaluated in setting of the emergency department any symptoms or concerns related to possible dehydration or electrolyte abnormalities this would require immediate interventions to stabilize patient.  Patient seen by provider in triage who confirmed he has not received any Tylenol  for fever.  Patient was given 650 mg of acetaminophen  for fever and advised to go immediately to [facility] in order to have laboratory studies completed to determine the source of fever and to ensure that metabolic status is stable and patient is not dehydrated.  Patient verbalized understanding agreement with plan and will go immediately to [facility]. 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   Pituitary adenoma  (HCC) 02/05/2022   Pituitary tumor 10/22/2019   Abnormal thyroid blood test 09/10/2019   GERD (gastroesophageal reflux disease) 09/10/2019   Hyperlipidemia 09/10/2019   Obesity (BMI 30-39.9) 09/10/2019   Vitamin D deficiency 09/10/2019   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      Home Medications    Prior to Admission medications   Medication Sig Start Date End Date Taking? Authorizing Provider  cholecalciferol (VITAMIN D3) 25 MCG (1000 UNIT) tablet Take 1,000 Units by mouth daily.   Yes [provider]  desmopressin (DDAVP) 0.1 MG tablet Take by mouth. 07/02/21  Yes [provider]  hydrocortisone (CORTEF) 10 MG tablet Hydrocortisone 10 mg am and half pill (5 mg) at lunch and 5 mg 4 pm 05/01/21  Yes [provider]  levothyroxine (SYNTHROID) 50 MCG tablet Take by mouth. 06/11/21  Yes [provider]  cyclobenzaprine  (FLEXERIL ) 10 MG tablet Take 1 tablet (10 mg total) by mouth 2 (two) times daily as needed for muscle spasms. 11/13/21   Vernestine Gondola, PA-C  dexamethasone  (DECADRON ) 2 MG tablet Take 1 tablet (2 mg total) by mouth daily. 05/31/19   Vaslow, Zachary K, MD  dexamethasone  (DECADRON ) 4 MG tablet dexamethasone  4 mg  tablet  TAKE 1 TABLET (4 MG TOTAL) BY MOUTH DAILY.    [provider]  escitalopram (LEXAPRO) 10 MG tablet Take 10 mg by mouth daily. 06/11/21   [provider]  ferrous sulfate 325 (65 FE) MG tablet Take 325 mg by mouth daily. 04/20/19 05/20/19  [provider]  hydrOXYzine (ATARAX) 10 MG tablet Take 10 mg by mouth at bedtime as needed. 06/11/21   [provider]  LORazepam  (ATIVAN) 0.5 MG tablet Take one tablet by mouth one hour prior to MRI. May repeat after 30 minutes if needed for anxiety 05/14/21   [provider]  metoprolol  tartrate (LOPRESSOR ) 100 MG tablet Take tablet TWO hours prior to your cardiac CT scan. 06/19/22   Harrold Lincoln, MD  pantoprazole (PROTONIX) 40 MG tablet Take 40 mg by mouth in the morning and at bedtime. x14 days 04/18/19 05/02/19  [provider]    Family History Family History  Problem Relation Age of Onset   Lung cancer Mother    Prostate cancer Father    Colon cancer Maternal Grandfather    Rectal cancer Neg Hx     Social History Social History   Tobacco Use   Smoking status: Former    Types: Cigarettes   Smokeless tobacco: Never  Vaping Use   Vaping status: Never Used  Substance Use Topics   Alcohol use: No   Drug use: No     Allergies   Amoxicillin and Penicillin g   Review of Systems Review of Systems Pertinent negatives listed in HPI   Physical Exam Triage Vital Signs ED Triage Vitals  Encounter Vitals Group     BP 06/25/23 0950 108/75     Systolic BP Percentile --      Diastolic BP Percentile --      Pulse Rate 06/25/23 0950 (!) 104     Resp 06/25/23 0950 (!) 26     Temp 06/25/23 0950 (!) 102.9 F (39.4 C)     Temp Source 06/25/23 0950 Oral     SpO2 06/25/23 0950 92 %     Weight --      Height --      Head Circumference --      Peak Flow --      Pain Score 06/25/23 0945 9     Pain Loc --      Pain Education --      Exclude from Growth Chart --    No data found.  Updated Vital Signs BP 108/75 (BP Location: Left Arm)   Pulse (!) 104   Temp (!) 102.9 F (39.4 C) (Oral)   Resp (!) 26   LMP 07/02/2012   SpO2 92%   Visual Acuity Right Eye Distance:   Left Eye Distance:   Bilateral Distance:    Right Eye Near:   Left Eye Near:    Bilateral Near:     Physical Exam Vitals reviewed.  Constitutional:      Appearance: Normal appearance. She is ill-appearing.   HENT:     Head: Normocephalic.     Nose: No congestion or rhinorrhea.  Cardiovascular:     Rate and Rhythm: Tachycardia present.  Pulmonary:     Effort: Tachypnea present.     Breath sounds: No decreased air movement. Rhonchi present.  Musculoskeletal:        General: Normal range of motion.     Cervical back: Normal range of motion and neck supple.  Skin:  General: Skin is warm and dry.  Neurological:     General: No focal deficit present.     Mental Status: She is alert and oriented to person, place, and time.     UC Treatments / Results  Labs (all labs ordered are listed, but only abnormal results are displayed) Labs Reviewed - No data to display  EKG   Radiology No results found.  Procedures Procedures (including critical care time)  Medications Ordered in UC Medications  acetaminophen  (TYLENOL ) tablet 650 mg (650 mg Oral Given 06/25/23 1007)    Initial Impression / Assessment and Plan / UC Course  I have reviewed the triage vital signs and the nursing notes.  Pertinent labs & imaging results that were available during my care of the patient were reviewed by me and considered in my medical decision making (see chart for details).    Diabetes insipidus with acute febrile illness the patient will follow-up at [facility] for evaluation and workup of current symptoms.  Patient received Tylenol  650 mg here at urgent care Final Clinical Impressions(s) / UC Diagnoses   Final diagnoses:  Diabetes insipidus (HCC)  Acute febrile illness     Discharge Instructions      No further emergency department that you received Tylenol  650 mg here at urgent care at 10:00 am     ED Prescriptions   None    PDMP not reviewed this encounter.   Buena Carmine, NP 06/25/23 1024

## 2023-06-26 LAB — URINE CULTURE: Culture: <10000 — AB

## 2023-06-30 ENCOUNTER — Emergency Department (HOSPITAL_BASED_OUTPATIENT_CLINIC_OR_DEPARTMENT_OTHER)
Admission: EM | Admit: 2023-06-30 | Discharge: 2023-06-30 | Disposition: A | Discharge status: Home / Self Care | Attending: Emergency Medicine | Admitting: Emergency Medicine

## 2023-06-30 ENCOUNTER — Other Ambulatory Visit: Payer: Self-pay

## 2023-06-30 ENCOUNTER — Encounter (HOSPITAL_BASED_OUTPATIENT_CLINIC_OR_DEPARTMENT_OTHER): Payer: Self-pay

## 2023-06-30 DIAGNOSIS — U071 COVID-19: Secondary | ICD-10-CM | POA: Diagnosis not present

## 2023-06-30 DIAGNOSIS — E039 Hypothyroidism, unspecified: Secondary | ICD-10-CM | POA: Insufficient documentation

## 2023-06-30 DIAGNOSIS — R519 Headache, unspecified: Secondary | ICD-10-CM | POA: Diagnosis present

## 2023-06-30 DIAGNOSIS — Z79899 Other long term (current) drug therapy: Secondary | ICD-10-CM | POA: Insufficient documentation

## 2023-06-30 DIAGNOSIS — G44209 Tension-type headache, unspecified, not intractable: Secondary | ICD-10-CM

## 2023-06-30 LAB — COMPREHENSIVE METABOLIC PANEL WITH GFR
ALT: 81 U/L — ABNORMAL HIGH (ref 0–44)
AST: 44 U/L — ABNORMAL HIGH (ref 15–41)
Albumin: 4.3 g/dL (ref 3.5–5.0)
Alkaline Phosphatase: 68 U/L (ref 38–126)
Anion gap: 12 (ref 5–15)
BUN: 11 mg/dL (ref 6–20)
CO2: 23 mmol/L (ref 22–32)
Calcium: 9.1 mg/dL (ref 8.9–10.3)
Chloride: 93 mmol/L — ABNORMAL LOW (ref 98–111)
Creatinine, Ser: 0.7 mg/dL (ref 0.44–1.00)
GFR, Estimated: >60 mL/min (ref >60)
Glucose, Bld: 98 mg/dL (ref 70–99)
Potassium: 4.1 mmol/L (ref 3.5–5.1)
Sodium: 128 mmol/L — ABNORMAL LOW (ref 135–145)
Total Bilirubin: 0.2 mg/dL (ref 0.0–1.2)
Total Protein: 7.1 g/dL (ref 6.5–8.1)

## 2023-06-30 LAB — CBC WITH DIFFERENTIAL/PLATELET
Abs Immature Granulocytes: 0.04 10*3/uL (ref 0.00–0.07)
Basophils Absolute: 0 10*3/uL (ref 0.0–0.1)
Basophils Relative: 0 %
Eosinophils Absolute: 0 10*3/uL (ref 0.0–0.5)
Eosinophils Relative: 0 %
HCT: 38.8 % (ref 36.0–46.0)
Hemoglobin: 13.4 g/dL (ref 12.0–15.0)
Immature Granulocytes: 1 %
Lymphocytes Relative: 42 %
Lymphs Abs: 2.8 10*3/uL (ref 0.7–4.0)
MCH: 29.8 pg (ref 26.0–34.0)
MCHC: 34.5 g/dL (ref 30.0–36.0)
MCV: 86.4 fL (ref 80.0–100.0)
Monocytes Absolute: 0.6 10*3/uL (ref 0.1–1.0)
Monocytes Relative: 9 %
Neutro Abs: 3.2 10*3/uL (ref 1.7–7.7)
Neutrophils Relative %: 48 %
Platelets: 228 10*3/uL (ref 150–400)
RBC: 4.49 MIL/uL (ref 3.87–5.11)
RDW: 13 % (ref 11.5–15.5)
WBC: 6.6 10*3/uL (ref 4.0–10.5)
nRBC: 0 % (ref 0.0–0.2)

## 2023-06-30 LAB — RESP PANEL BY RT-PCR (RSV, FLU A&B, COVID)  RVPGX2
Influenza A by PCR: NEGATIVE
Influenza B by PCR: NEGATIVE
Resp Syncytial Virus by PCR: NEGATIVE
SARS Coronavirus 2 by RT PCR: POSITIVE — AB

## 2023-06-30 LAB — CULTURE, BLOOD (ROUTINE X 2)
Culture: NO GROWTH
Culture: NO GROWTH
Special Requests: ADEQUATE
Special Requests: ADEQUATE

## 2023-06-30 LAB — TSH: TSH: 0.023 u[IU]/mL — ABNORMAL LOW (ref 0.350–4.500)

## 2023-06-30 MED ORDER — SODIUM CHLORIDE 0.9 % IV BOLUS
500.0000 mL | Freq: Once | INTRAVENOUS | Status: AC
Start: 2023-06-30 — End: 2023-06-30
  Administered 2023-06-30: 500 mL via INTRAVENOUS

## 2023-06-30 MED ORDER — DIPHENHYDRAMINE HCL 50 MG/ML IJ SOLN
12.5000 mg | Freq: Once | INTRAMUSCULAR | Status: AC
  Administered 2023-06-30: 12.5 mg via INTRAVENOUS
  Filled 2023-06-30: qty 1

## 2023-06-30 MED ORDER — METOCLOPRAMIDE HCL 5 MG/ML IJ SOLN
5.0000 mg | Freq: Once | INTRAMUSCULAR | Status: AC
  Administered 2023-06-30: 5 mg via INTRAVENOUS
  Filled 2023-06-30: qty 2

## 2023-06-30 MED ORDER — KETOROLAC TROMETHAMINE 15 MG/ML IJ SOLN
15.0000 mg | Freq: Once | INTRAMUSCULAR | Status: AC
  Administered 2023-06-30: 15 mg via INTRAVENOUS
  Filled 2023-06-30: qty 1

## 2023-06-30 NOTE — Discharge Instructions (Signed)
 Please follow-up with your endocrinologist for further management of TSH and T4. Please follow up with PCP for LFT recheck.

## 2023-06-30 NOTE — ED Notes (Signed)
 Second draw of T4 obtained and taken to lab

## 2023-06-30 NOTE — ED Provider Triage Note (Signed)
 Emergency Medicine Provider Triage Evaluation Note  Rachel Bowen , a 58 y.o. female  was evaluated in triage.  Pt complains of generalized fatigue, headache.  States she has been ill for the past several days.  Was seen in the ED on 06/25/2023 and diagnosed with COVID.  Reports continued generalized weakness.  Does report headache that began around noon today rating to the back of her neck with midline sensation around her head when she was at home.  States that she has been dealing with headache intermittently over the past month or so with most recent CT scan 06/18/23.  States her current headache feels similar to the headache she has been getting on and off for the past month.  Review of Systems  Positive: See above Negative:   Physical Exam  BP (!) 143/101 (BP Location: Right Arm)   Pulse 72   Temp 97.9 F (36.6 C)   Resp 16   LMP 07/02/2012   SpO2 100%  Gen:   Awake, no distress   Resp:  Normal effort  MSK:   Moves extremities without difficulty  Other:  Cranial nerves III through XII grossly intact.  Medical Decision Making  Medically screening exam initiated at 6:03 PM.  Appropriate orders placed.  Rachel Bowen was informed that the remainder of the evaluation will be completed by another provider, this initial triage assessment does not replace that evaluation, and the importance of remaining in the ED until their evaluation is complete.     Huron Butter, [place] 06/30/23 1806

## 2023-06-30 NOTE — ED Provider Notes (Signed)
 Patient care taken over at shift change. Patient to receive headache cocktail and check T4 levels - will need to follow up outpatient for further management of thyroid dysfunction. Physical Exam  BP (!) 143/101 (BP Location: Right Arm)   Pulse 72   Temp 97.9 F (36.6 C)   Resp 16   LMP 07/02/2012   SpO2 100%   Physical Exam  Procedures  Procedures  ED Course / MDM    Medical Decision Making Amount and/or Complexity of Data Reviewed Labs: ordered.  Risk Prescription drug management.   On reevaluation, patient reports that headache has improved.  Since the outcome of the T4 level will not change treatment or management, we will discharge home with level pending.  She has been instructed to call her endocrinologist to follow-up for reevaluation of her thyroid levels.  She is agreeable with this plan.  Patient is stable and safe for discharge home. Return precautions given.   Sonnie Dusky, PA-C 06/30/23 2322    Arvilla Birmingham, MD 07/03/23 [phone]

## 2023-06-30 NOTE — ED Triage Notes (Signed)
 Pt c/o adrenal insufficiency, dx w covid 5/7, "since negative, but I have a bad HA." Advises that PCP wants her to get thyroid checked. Endorses "a little" light sensitivity, nausea.

## 2023-06-30 NOTE — ED Provider Notes (Signed)
 [facility] EMERGENCY DEPARTMENT AT [facility] Provider Note   CSN: [id number] Arrival date & time: 06/30/23  1725     History  Chief Complaint  Patient presents with   Headache    Rachel Bowen is a 58 y.o. female.  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  Headache is been present for 5 days since she was diagnosed with COVID.  She reports her URI-like symptoms have improved but continues to have fatigue and feeling unwell.  Headache is associated with some sensitivity to light and also nausea.  She is tolerating oral intake.  Denies any vision change, slurred speech or confusion or difficulty moving extremities.  Denies any presyncope or syncopal-like episode.  Reports she was going to have labs drawn with PCP and wondering if we can check TSH, T4 since that was going to happen tomorrow.    Headache      Home Medications Prior to Admission medications   Medication Sig Start Date End Date Taking? Authorizing Provider  cholecalciferol (VITAMIN D3) 25 MCG (1000 UNIT) tablet Take 1,000 Units by mouth daily.    [provider]  cyclobenzaprine  (FLEXERIL ) 10 MG tablet Take 1 tablet (10 mg total) by mouth 2 (two) times daily as needed for muscle spasms. 11/13/21   Vernestine Gondola, PA-C  desmopressin (DDAVP) 0.1 MG tablet Take by mouth. 07/02/21   [provider]  dexamethasone  (DECADRON ) 2 MG tablet Take 1 tablet (2 mg total) by mouth daily. 05/31/19   Vaslow, Zachary K, MD  dexamethasone  (DECADRON ) 4 MG tablet dexamethasone  4 mg tablet  TAKE 1 TABLET (4 MG TOTAL) BY MOUTH DAILY.    [provider]  escitalopram (LEXAPRO) 10 MG tablet Take 10 mg by mouth daily. 06/11/21   [provider]  ferrous sulfate 325 (65 FE) MG tablet Take 325 mg by mouth daily. 04/20/19 05/20/19  [provider]  hydrocortisone (CORTEF) 10 MG tablet Hydrocortisone 10 mg am and half pill (5 mg) at lunch and 5 mg 4 pm 05/01/21   [provider]  hydrOXYzine (ATARAX) 10 MG tablet Take 10 mg by mouth at bedtime as needed. 06/11/21   [provider]  levothyroxine (SYNTHROID) 50 MCG tablet Take by mouth. 06/11/21   [provider]  LORazepam (ATIVAN) 0.5 MG tablet Take one tablet by mouth one hour prior to MRI. May repeat after 30 minutes if needed for anxiety 05/14/21   [provider]  metoprolol  tartrate (LOPRESSOR ) 100 MG tablet Take tablet TWO hours prior to your cardiac CT scan. 06/19/22   O'NealCathay Clonts, MD  nirmatrelvir /ritonavir , renal dosing, (PAXLOVID , 150/100,) 10 x 150 MG & 10 x 100MG  TBPK Take 2 tablets by mouth 2 (two) times daily for 5 days. Dosage for moderate renal impairment (eGFR >/= 30 to <60 mL/min): 150 mg nirmatrelvir  (one 150 mg tablet) with 100 mg ritonavir  (one 100 mg tablet), with both tablets taken together twice daily for 5 days. Not recommended if eGFR < 30 mL/min.  PAXLOVID  is not recommend in patients with severe hepatic impairment (Child-Pugh Class C). 06/25/23 06/30/23  Baxter Limber A, PA-C  ondansetron  (ZOFRAN -ODT) 4 MG disintegrating tablet Take 1 tablet (4 mg total) by mouth every 6 (six) hours as needed for nausea or vomiting. 06/25/23   Baxter Limber A, PA-C  pantoprazole (PROTONIX) 40 MG tablet Take 40 mg by mouth in the  morning and at bedtime. x14 days 04/18/19 05/02/19  [provider]      Allergies    Amoxicillin and Penicillin g    Review of Systems   Review of Systems  Neurological:  Positive for headaches.    Physical Exam Updated Vital Signs BP (!) 143/101 (BP Location: Right Arm)   Pulse 72   Temp 97.9 F (36.6 C)   Resp 16   LMP 07/02/2012   SpO2 100%  Physical Exam Vitals and nursing note reviewed.  Constitutional:      General: She is not in acute distress.    Appearance: She is not  toxic-appearing.  HENT:     Head: Normocephalic and atraumatic.  Eyes:     General: No scleral icterus.    Conjunctiva/sclera: Conjunctivae normal.  Cardiovascular:     Rate and Rhythm: Normal rate and regular rhythm.     Pulses: Normal pulses.     Heart sounds: Normal heart sounds.  Pulmonary:     Effort: Pulmonary effort is normal. No respiratory distress.     Breath sounds: Normal breath sounds.  Abdominal:     General: Abdomen is flat. Bowel sounds are normal.     Palpations: Abdomen is soft.     Tenderness: There is no abdominal tenderness.  Skin:    General: Skin is warm and dry.     Findings: No lesion.  Neurological:     General: No focal deficit present.     Mental Status: She is alert and oriented to person, place, and time. Mental status is at baseline.     Comments: GCS 15 Is alert and oriented with no slurred speech.  No visual field present.  Pupils equal and reactive no nystagmus.  Upper and lower extremity sensation and strength is equal bilaterally.     ED Results / Procedures / Treatments   Labs (all labs ordered are listed, but only abnormal results are displayed) Labs Reviewed  RESP PANEL BY RT-PCR (RSV, FLU A&B, COVID)  RVPGX2 - Abnormal; Notable for the following components:      Result Value   SARS Coronavirus 2 by RT PCR POSITIVE (*)    All other components within normal limits  COMPREHENSIVE METABOLIC PANEL WITH GFR - Abnormal; Notable for the following components:   Sodium 128 (*)    Chloride 93 (*)    AST 44 (*)    ALT 81 (*)    All other components within normal limits  TSH - Abnormal; Notable for the following components:   TSH 0.023 (*)    All other components within normal limits  CBC WITH DIFFERENTIAL/PLATELET  URINALYSIS, ROUTINE W REFLEX MICROSCOPIC  T4, FREE    EKG None  Radiology No results found.  Procedures Procedures    Medications Ordered in ED Medications  sodium chloride  0.9 % bolus 500 mL (500 mLs Intravenous New  Bag/Given 06/30/23 2152)  metoCLOPramide  (REGLAN ) injection 5 mg (5 mg Intravenous Given 06/30/23 2156)  ketorolac  (TORADOL ) 15 MG/ML injection 15 mg (15 mg Intravenous Given 06/30/23 2154)  diphenhydrAMINE  (BENADRYL ) injection 12.5 mg (12.5 mg Intravenous Given 06/30/23 2155)    ED Course/ Medical Decision Making/ A&P                                 Medical Decision Making Amount and/or Complexity of Data Reviewed Labs: ordered.  Risk Prescription drug management.   Rachel Bowen 58 y.o. presented  today for HA. Working Ddx: tension headache, migraine, intracranial mass, intracranial hemorrhage, intracranial infection including meningitis vs encephalitis, trigeminal neuralgia, AVM, sinusitis, cerebral aneurysm, muscular headache, cavernous sinus thrombosis, carotid artery dissection.  R/o DDx: intracranial mass, hemorrhage,or infection including meningitis vs encephalitis, trigeminal neuralgia, AVM, cerebral aneurysm, muscular headache, cavernous sinus thrombosis, carotid artery dissection are less likely due to history of present illness, physical exam, labs/imaging findings  PMHX: panhypopituitarsim    Unique Tests and My Interpretation:  CBC without leukocytosis and no significant anemia.  Respiratory panel is positive for COVID.  CMP remarkable for sodium of 128 will supplement with normal saline.  Liver enzymes are slightly elevated but similar to prior recent labs.  Normal T. bili and no focal area of abdominal tenderness.  She will recheck LFTs with primary care.  TSH and T4 ordered will follow-up with endocrine for results, comes in vitals are not consistent with thyroid storm.  CT Head w/o Contrast: considered, but HA present for 5 days and tension-type w/o focal deficits, no red flag symptoms.  Problem List / ED Course / Critical interventions / Medication management  Reporting to emergency room with headache.  Recently got over a viral illness.  She is hemodynamically stable  and well-appearing.  She has no red flag symptoms associated with her headache.  She has nonfocal neurological exam.  No neck rigidity or meningeal signs.  No injury trauma or fall.  Feel this is likely secondary to viral illness and tension related headache.  She wants her thyroid checked, symptoms are not consistent with thyroid storm.  Will check TSH and T4 and send off.  She will follow-up with her endocrinologist for these results.  Given reassuring symptoms do not feel that imaging is needed at this time.  Of note she reports that her URI-like symptoms are gradually improving thus I do not feel chest x-ray is necessary and lungs are clear to auscultation she is not hypoxic. I ordered medication including NS for sodium, migraine cocktail. Reevaluation of the patient after these medicines showed that the patient improved Patients vitals assessed. Upon arrival patient is hemodynamically stable.  I have reviewed the patients home medicines and have made adjustments as needed  Plan: Signed off to [place] PA at shift change pending repeat assessment after migraine cocktail.            Final Clinical Impression(s) / ED Diagnoses Final diagnoses:  COVID  Acute non intractable tension-type headache    Rx / DC Orders ED Discharge Orders     None         Randell Teare N, PA-C 06/30/23 2207    Arvilla Birmingham, MD 06/30/23 2311

## 2023-07-01 LAB — T4, FREE: Free T4: 0.65 ng/dL (ref 0.61–1.12)

## 2023-07-30 ENCOUNTER — Other Ambulatory Visit: Payer: Self-pay | Admitting: Internal Medicine

## 2023-07-30 DIAGNOSIS — Z1231 Encounter for screening mammogram for malignant neoplasm of breast: Secondary | ICD-10-CM

## 2023-08-28 ENCOUNTER — Ambulatory Visit
Admission: RE | Admit: 2023-08-28 | Discharge: 2023-08-28 | Disposition: A | Discharge status: Home / Self Care | Source: Ambulatory Visit | Attending: Internal Medicine | Admitting: Internal Medicine

## 2023-08-28 DIAGNOSIS — Z1231 Encounter for screening mammogram for malignant neoplasm of breast: Secondary | ICD-10-CM

## 2023-08-30 ENCOUNTER — Encounter: Payer: Self-pay | Admitting: Emergency Medicine

## 2023-08-30 ENCOUNTER — Ambulatory Visit: Admission: EM | Admit: 2023-08-30 | Discharge: 2023-08-30 | Disposition: A | Discharge status: Home / Self Care

## 2023-08-30 DIAGNOSIS — T63481A Toxic effect of venom of other arthropod, accidental (unintentional), initial encounter: Secondary | ICD-10-CM

## 2023-08-30 MED ORDER — HYDROXYZINE HCL 25 MG PO TABS: 25.0000 mg | ORAL_TABLET | Freq: Four times a day (QID) | ORAL | 0 refills | Status: AC | PRN

## 2023-08-30 NOTE — Discharge Instructions (Signed)
 Can take hydroxyzine  as needed for itching Apply hydrocortisone cream Elevate to help with swelling Can take Tylenol  as needed for pain

## 2023-08-30 NOTE — ED Provider Notes (Signed)
 [facility]    CSN: [id number] Arrival date & time: 08/30/23  1322      History   Chief Complaint Chief Complaint  Patient presents with   Insect Bite    HPI Rachel Bowen is a 58 y.o. female.   Patient presents with insect sting to her left hand middle finger that occurred 3 days ago.  She reports Friday the pain and burning and itching were worse.  She is continued with some pain and itching today swelling has decreased a little bit.  She has been taking Benadryl  with minimal relief.  She is currently on daily steroids.  Denies fever, chills.    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   Secondary hypothyroidism 02/05/2022   Pituitary adenoma (HCC) 02/05/2022   Pituitary tumor 10/22/2019   Abnormal thyroid  blood test 09/10/2019   GERD (gastroesophageal reflux disease) 09/10/2019   Hyperlipidemia 09/10/2019   Obesity (BMI 30-39.9) 09/10/2019   Vitamin D deficiency 09/10/2019   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      Home Medications    Prior to Admission medications   Medication Sig Start Date End Date Taking? Authorizing Provider  azithromycin  (ZITHROMAX ) 250 MG tablet Take by mouth. 07/31/23  Yes [provider]   butalbital-acetaminophen -caffeine (FIORICET) 50-325-40 MG tablet Take 1 tablet by mouth every 6 (six) hours as needed. 07/31/23  Yes [provider]  Fingerstix Lancets MISC 1 each by Other route. 05/08/23 05/07/24 Yes [provider]  glucose blood (PRECISION QID TEST) test strip 1 strip. 04/04/23 04/03/24 Yes [provider]  hydrocortisone sodium succinate (SOLU-CORTEF) 100 MG injection Inject 100 mg into the muscle. 11/02/21  Yes [provider]  hydrOXYzine  (ATARAX ) 25 MG tablet Take 1 tablet (25 mg total) by mouth every 6 (six) hours as needed. Prn itching 08/30/23  Yes Ward, Harlene PEDLAR, PA-C  Multiple Vitamins-Minerals (EMERGEN-C VITAMIN C) CHEW Chew 1 tablet by mouth daily. 04/18/19  Yes [provider]  Calcium Carb-Cholecalciferol 500-10 MG-MCG TABS Take 1 tablet by mouth.    [provider]  cholecalciferol (VITAMIN D3) 25 MCG (1000 UNIT) tablet Take 1,000 Units by mouth daily.    [provider]  cyclobenzaprine  (FLEXERIL ) 10 MG tablet Take 1 tablet (10 mg total) by mouth 2 (two) times daily as needed for muscle spasms. 11/13/21   Billy Asberry FALCON, PA-C  desmopressin (DDAVP) 0.1 MG tablet Take by mouth. 07/02/21   [provider]  dexamethasone  (DECADRON ) 2 MG tablet Take 1 tablet (2 mg total) by mouth daily. 05/31/19   Vaslow, Zachary K, MD  dexamethasone  (DECADRON ) 4 MG tablet dexamethasone  4 mg tablet  TAKE 1 TABLET (4 MG TOTAL) BY MOUTH DAILY.    [provider]  escitalopram (LEXAPRO) 10 MG  tablet Take 10 mg by mouth daily. 06/11/21   [provider]  ferrous sulfate 325 (65 FE) MG tablet Take 325 mg by mouth daily. 04/20/19 05/20/19  [provider]  hydrocortisone (CORTEF) 10 MG tablet Hydrocortisone 10 mg am and half pill (5 mg) at lunch and 5 mg 4 pm 05/01/21   [provider]  levothyroxine (SYNTHROID) 50 MCG tablet Take by mouth. 06/11/21   [provider]  LORazepam (ATIVAN)  0.5 MG tablet Take one tablet by mouth one hour prior to MRI. May repeat after 30 minutes if needed for anxiety 05/14/21   [provider]  metoprolol  tartrate (LOPRESSOR ) 100 MG tablet Take tablet TWO hours prior to your cardiac CT scan. 06/19/22   O'Neal, Darryle Ned, MD  ondansetron  (ZOFRAN -ODT) 4 MG disintegrating tablet Take 1 tablet (4 mg total) by mouth every 6 (six) hours as needed for nausea or vomiting. 06/25/23   Beatty, Celeste A, PA-C  ONETOUCH ULTRA TEST test strip SMARTSIG:1 Each Twice Daily    [provider]  pantoprazole (PROTONIX) 40 MG tablet Take 40 mg by mouth in the morning and at bedtime. x14 days 04/18/19 05/02/19  [provider]    Family History Family History  Problem Relation Age of Onset   Lung cancer Mother    Prostate cancer Father    Colon cancer Maternal Grandfather    Rectal cancer Neg Hx     Social History Social History   Tobacco Use   Smoking status: Former    Types: Cigarettes    Passive exposure: Past   Smokeless tobacco: Never  Vaping Use   Vaping status: Never Used  Substance Use Topics   Alcohol use: No   Drug use: No     Allergies   Amoxicillin and Penicillin g   Review of Systems Review of Systems  Constitutional:  Negative for chills and fever.  HENT:  Negative for ear pain and sore throat.   Eyes:  Negative for pain and visual disturbance.  Respiratory:  Negative for cough and shortness of breath.   Cardiovascular:  Negative for chest pain and palpitations.  Gastrointestinal:  Negative for abdominal pain and vomiting.  Genitourinary:  Negative for dysuria and hematuria.  Musculoskeletal:  Negative for arthralgias and back pain.  Skin:  Positive for wound. Negative for color change and rash.  Neurological:  Negative for seizures and syncope.  All other systems reviewed and are negative.    Physical Exam Triage Vital Signs ED Triage Vitals  Encounter Vitals Group     BP 08/30/23 1418 132/83      Girls Systolic BP Percentile --      Girls Diastolic BP Percentile --      Boys Systolic BP Percentile --      Boys Diastolic BP Percentile --      Pulse Rate 08/30/23 1418 69     Resp 08/30/23 1418 18     Temp 08/30/23 1418 98.4 F (36.9 C)     Temp Source 08/30/23 1418 Oral     SpO2 08/30/23 1418 98 %     Weight 08/30/23 1417 190 lb (86.2 kg)     Height --      Head Circumference --      Peak Flow --      Pain Score 08/30/23 1416 6     Pain Loc --      Pain Education --      Exclude from Growth Chart --  No data found.  Updated Vital Signs BP 132/83 (BP Location: Left Arm)   Pulse 69   Temp 98.4 F (36.9 C) (Oral)   Resp 18   Wt 190 lb (86.2 kg)   LMP 07/02/2012   SpO2 98%   BMI 34.75 kg/m   Visual Acuity Right Eye Distance:   Left Eye Distance:   Bilateral Distance:    Right Eye Near:   Left Eye Near:    Bilateral Near:     Physical Exam Vitals and nursing note reviewed.  Constitutional:      General: She is not in acute distress.    Appearance: She is well-developed.  HENT:     Head: Normocephalic and atraumatic.  Eyes:     Conjunctiva/sclera: Conjunctivae normal.  Cardiovascular:     Rate and Rhythm: Normal rate and regular rhythm.     Heart sounds: No murmur heard. Pulmonary:     Effort: Pulmonary effort is normal. No respiratory distress.     Breath sounds: Normal breath sounds.  Abdominal:     Palpations: Abdomen is soft.     Tenderness: There is no abdominal tenderness.  Musculoskeletal:        General: No swelling.     Cervical back: Neck supple.  Skin:    General: Skin is warm and dry.     Capillary Refill: Capillary refill takes less than 2 seconds.     Comments: Sting to dorsal aspect of left middle finger with surrounding mild swelling and redness.  Normal range of motion of hand and wrist.  Neurological:     Mental Status: She is alert.  Psychiatric:        Mood and Affect: Mood normal.      UC Treatments / Results   Labs (all labs ordered are listed, but only abnormal results are displayed) Labs Reviewed - No data to display  EKG   Radiology No results found.  Procedures Procedures (including critical care time)  Medications Ordered in UC Medications - No data to display  Initial Impression / Assessment and Plan / UC Course  I have reviewed the triage vital signs and the nursing notes.  Pertinent labs & imaging results that were available during my care of the patient were reviewed by me and considered in my medical decision making (see chart for details).     Appears to be localized reaction from insect sting.  She is currently on steroids daily already.  Will send in Atarax  for itching and advised hydrocortisone, ice, elevation.  If no improvement follow-up with primary care physician. Final Clinical Impressions(s) / UC Diagnoses   Final diagnoses:  Insect stings, accidental or unintentional, initial encounter     Discharge Instructions      Can take hydroxyzine  as needed for itching Apply hydrocortisone cream Elevate to help with swelling Can take Tylenol  as needed for pain   ED Prescriptions     Medication Sig Dispense Auth. Provider   hydrOXYzine  (ATARAX ) 25 MG tablet Take 1 tablet (25 mg total) by mouth every 6 (six) hours as needed. Prn itching 12 tablet Ward, Cassadie Pankonin Z, PA-C      PDMP not reviewed this encounter.   Ward, Harlene PEDLAR, PA-C 08/30/23 1435

## 2023-08-30 NOTE — ED Triage Notes (Signed)
 Pt presents c/o bee sting on left hand since Thursday. Pt says she took some otc Benedryl but it just made her sleep. Pt says her hand is still in pain, swelling, itchy.

## 2023-11-07 ENCOUNTER — Other Ambulatory Visit (HOSPITAL_BASED_OUTPATIENT_CLINIC_OR_DEPARTMENT_OTHER): Payer: Self-pay | Admitting: Endocrinology

## 2023-11-07 DIAGNOSIS — M8580 Other specified disorders of bone density and structure, unspecified site: Secondary | ICD-10-CM

## 2023-11-07 DIAGNOSIS — E038 Other specified hypothyroidism: Secondary | ICD-10-CM

## 2024-01-01 NOTE — Progress Notes (Signed)
 [facility]       [address]  [place], [place] [address]  TELEPHONE: [phone]  FACSIMILE: [phone] ---------------------------------------------------------------------------------------------------------------------- ---------------------------------------------------------------------------------------------------------------------- Primary Endocrinologist:  Delon Butts, MD  Primary Endocrinology APP provider:   ---------------------------------------------------------------------------------------------------------------------- Chief Complaint (cc) and the main endocrine problems for visit:  Pituitary  ----------------------------------------------------------------------------------------------------------------------   Main Endocrine problems or special notations (if applicable see endocrine summary below):   Pituitary -panhypopit with DI   Sellar Mass/Lesion-->now s/p 10/22/2019, NEUROENDOSCOPY INTRACRANIAL WITH EXCISION PITUITARY TUMOR TRANSNASAL OR TRANSSPHENOIDAL APPROACH concern for craniophy. Pending future XRT, tumor encasing stalk. Pt developed DI inpt and then didn't need DDAVP for a few days, but outpatient developed large break through urination >3-4 L/day needing prn DDAVP and pt doing better.  Now labs c/w 2nd Hypothyroidism Pt has difficulty coming off steroid and given above I think AI will be long-term  Will term pt as panhypopit with DI   H/o A1c in DM2 range in past with steroid component      Sellar Mass/Lesion-->now s/p 10/22/2019, NEUROENDOSCOPY INTRACRANIAL WITH EXCISION PITUITARY TUMOR TRANSNASAL OR TRANSSPHENOIDAL APPROACH concern for craniophy.   Hypopituitarism with  DI-central AVP-deficiency  Secondary adrenal insufficiency  Secondary hypothyroidism:  Secondary hypothyroidism WE FOLLOW FT4  (DO NOT FOLLOW TSH given low due to pituitary issues), Goal FT4 mid normal to upper normal range   H/o DM2 Recent A1c  stable ----------------------------------------------------------------------------------------------------------------------   Main HPI & AP focused discussion regarding the visit:   Patient ID:  58 y.o. pleasant female with h/o  sellar mass. Pt saw NSG and per their note  patient was told she had a pituitary adenoma in 2015 which prompted an MRI in 2016.  Because of changes in her employment she was unable to receive formal follow-up.   The patient sought medical attention in March of 2021 when she developed dizziness, lightheadedness, and a feeling as if her legs were giving out.  Additional symptoms at this time included sharp headaches and feeling of pressure behind her eyes. The patient subsequently underwent two MRI brains, an endocrinological workup and visual field testing. She was found to have a serum cortisol <1 and was started on Decadron  which has been subsequently tapered to 2 mg daily.  The patient reports that when she attempted to reduce the dose to 1 mg daily she had recurrence of her symptoms.   The patient underwent an MRI of her brain on 04/22/2019 and 05/13/2019 in the setting of the aforementioned symptoms.  This shows a lesion that is both intra and suprasellar.  It appears enlarged from the study in 2016, in particular the suprasellar component.    The patient underwent ophthalmological evaluation on 06/10/2019. The records were sent for review and note that the patient has normal visual field in both eyes, no papilledema or optic nerve atrophy.      Per their note: She noted significant symptom improvement after steroids were initiated by her astute neurologist. The imaging available does not appear consistent with a pituitary adenoma.  See interval visit details in past notes. Most recent developments below:  now s/p 10/22/2019, NEUROENDOSCOPY INTRACRANIAL WITH EXCISION PITUITARY TUMOR TRANSNASAL OR TRANSSPHENOIDAL APPROACH concern for craniophy. Pending future XRT, tumor  encasing stalk. Pt developed DI inpt and then didn't need DDAVP for a few days, but outpatient developed large break through urination >3-4 L/day needing prn DDAVP and pt doing better.  Now labs c/w 2nd Hypothyroidism Pt has difficulty coming off steroid and given above  I think AI will be long-term  Will term pt as panhypopit with DI      ----------------------------------------------------------------------------------------------------------------------     Pt followed for below:  Sellar Mass/Lesion-->now s/p 10/22/2019, NEUROENDOSCOPY INTRACRANIAL WITH EXCISION PITUITARY TUMOR TRANSNASAL OR TRANSSPHENOIDAL APPROACH concern for craniophy.   Hypopituitarism with  DI-central AVP-deficiency  Secondary adrenal insufficiency  Secondary hypothyroidism:  Secondary hypothyroidism WE FOLLOW FT4  (DO NOT FOLLOW TSH given low due to pituitary issues), Goal FT4 mid normal to upper normal range   H/o DM2 Recent A1c stable Discussed statin therapy in past and pt wanted to hold off   H/o osteopenia  Last DEXA 2021   *See above for Endocrine Patient ID or Endocrine History Summary (note: parts can be copied or adapted from past notes to preserve history)  -----------------------------------------------------------------------------------------------------  Visit:    01/01/2024   [facility])  Interval history/HPI:   History of Present Illness   Rachel Bowen is a 58 year old female seen in follow up for panhypopituitarism see above  Fatigue and myalgia - Significant fatigue and muscle pain, especially after physical exertion - Pronounced pain in the back of the neck and generalized muscle aches after trying to lift client.  -no red flag s/sxs asked pt to fu with pcp -no proximal weakness    Hydrocortisone therapy for secondary AI  - Current regimen: 10 mg in the morning, 5 mg at lunch, and 5 mg in the evening - Dose adjusted during a recent cold - Generally feels well on this  regimen but continues to experience some fatigue and muscle aches  Weight changes and body habitus - Weight loss from 190 lbs to 184 lbs while attempting to eat healthily - Recent URI doing better, knows about sick day rules        ---------------------------------------------------------------------------------------------------------------------- Exam:  Vitals:   01/01/24 1105  Weight: 83 kg (183 lb)  Height: 157.5 cm (5' 2)  PainSc:   6  PainLoc: Head - Frontal     Physical Exam   MEASUREMENTS: Weight- 184.     vidoe  ----------------------------------------------------------------------------------------------------------------------  Data:   Results   LABS Sodium: 140 mEq/L (12/29/2023)      ---------------------------------------------------------------------------------------------------------------------- A & P and discussion    Assessment & Plan    Osteopenia -Reschedule bone density, medically necessary    Sellar Mass/Lesion-->now s/p 10/22/2019, NEUROENDOSCOPY INTRACRANIAL WITH EXCISION PITUITARY TUMOR TRANSNASAL OR TRANSSPHENOIDAL  Per rad/onc Clinically and radiographically stable pituitary-area mass s/p FSRT.  Follow up in 1 year    Recent stable imaging fu with radiation oncology IMPRESSION:  Stable postoperative appearance of the sella.   Electronically Reviewed by:  Norleen Millin, MD, [facility] Electronically Reviewed on:  10/06/2023 1:10 PM   Secondary adrenal insufficiency -panhypopit with DI  Managed with hydrocortisone. Recent dose adjustment due to illness.  - Continue hydrocortisone 10 mg morning, 5 mg lunch, 5 mg evening. - Increase hydrocortisone to double daily dose during illness or fever, taper after recovery. - Ensure cortisol emergency kit is current. - Provided medical alert bracelet information. - Reviewed how to Monitor for adrenal crisis signs, seek emergency care if symptoms occur.   Secondary hypothyroidism (monitor free  T4) - Scheduled thyroid  level test at [facility].  Fatigue and muscle aches after lifting heavy client  - Avoid lifting heavy objects without assistance. - Monitor for muscle strain signs, adjust activities as needed. -follow up with pcp   ---------------------------------------------------------------------------------------------------------------------- Return in about 15 weeks (around 04/15/2024), or keep feb, make appt at  [facility] for labs in hear future.   No diagnosis found.   Requested Prescriptions    No prescriptions requested or ordered in this encounter     No orders of the defined types were placed in this encounter.    -----------------------------------------------------------------------------------------------------    This video encounter was conducted with the patient's (or proxy's) verbal consent via secure, interactive audio and video telecommunications while away from clinic/office/hospital.  The patient (or proxy) was instructed to have this encounter in a suitably private space and to only have persons present to whom they give permission to participate. In addition, patient identity was confirmed by use of name plus two identifiers. This visit was coded based on medical decision making (MDM).    ---------------------------------------------------------------------------------------------------------------------- Note: This document was prepared with a combination of voice recognition automated tools, voice recognition dictation technology, digital templates, medical abbreviation, outline format notation, and computer smart phrase technology. Any transcriptional typos that result from this process are unintentional. Document creation typos have been sought and reviewed for accuracy, but may not always have been located. Such creation typos do not reflect on the standard of medical care.   -----------------------------------------------------------------------------------------------------   ----------------------------------------------------------------------------------------------------------------------  Patient Instructions  Thank you for choosing [facility] for your health care. Because we care about our patients, we want to give you some helpful information.   Note: If you are experiencing a medical emergency or need urgent medical care, call 911 immediately.  Endocrinology Appointment Center  [phone], option 2 Monday through Friday 7:30 a.m. - 4:30 p.m.  To schedule, reschedule, or cancel your Endocrinology appointment, please call [phone], option 2. If you are unable to come to an appointment, please notify us  as soon as possible, preferably 24 hours in advance. Doing so may allow other patients with urgent needs to be scheduled in a cancelled appointment slot.   Endocrinology Nurse Triage [phone], option 2   Monday through Friday 8:00 a.m. - 4:00 p.m. If you require medical advice or have any endocrine-related symptoms and need to speak to your providers healthcare team, please call the Endocrinology Nurse Triage [phone], option 2.  For urgent medical concerns after hours or on holidays, call [phone] and ask to speak to the endocrinologist on call.  For non-urgent questions, please send the endocrine healthcare team a message through MyChart at [email or web address].   MyChart messages and voicemail are not checked nights, weekends and holidays. For MyChart messages please allow up to 3 business days to process and for voicemail please allow up to 24 business hours   Prescription Refill Requests Contact your pharmacy  To request prescription refills, please contact your pharmacy or send your healthcare team a Rx Renewal Request through your MyChart account at [email or web address] by using the Request RX  Renewal Function.  Supply Forms and Prior Authorizations Call [phone], option 2  Please allow 7-10 business days to process forms or to complete prior authorizations.  For questions, please call [phone], option 2  then option 3 (to speak to a nurse) and option 2 (clinic 1A) location (and choose the and select the for assistance).   Test Results  Test results, which may take up to 14 days, will be communicated to you through your MyChart account at [email or web address]. If you do not have MyChart, you will receive a letter by mail which may extend this notification time.   Insulin Pumps and Glucose Meters  If you have an insulin pump or glucose meter, please download them  prior to your visit and bring them with you to your appointment.    ----------------------------------------------------------------------------------------------------------------------  Vitals:   01/01/24 1105  Weight: 83 kg (183 lb)  Height: 157.5 cm (5' 2)  PainSc:   6  PainLoc: Head - Frontal      General: The patient was A and O and in no apparent distress today   Respiratory:  Normal respiratory effort  ----------------------------------------------------------------------------------------------------------------------  Zeb Information Reviewed:  Patient Active Problem List  Diagnosis   Pituitary tumor   Abnormal thyroid  blood test   Brain mass   Craniopharyngioma (CMS/HHS-HCC)   GERD (gastroesophageal reflux disease)   Mixed hyperlipidemia   Obesity (BMI 30-39.9), unspecified   Type 2 diabetes mellitus with hyperglycemia, without long-term current use of insulin (CMS/HHS-HCC)   Vitamin D deficiency   Diabetes insipidus (HHS-HCC)   Panhypopituitarism (HHS-HCC)   Pituitary adenoma (CMS/HHS-HCC)   Secondary hypothyroidism   Anxiety and depression   Genital herpes simplex   Anxiety   Severe obesity (BMI 35.0-39.9) with comorbidity (CMS-HCC)    Past Medical History:  Diagnosis Date   Anxiety    Depression    Gestational diabetes (HHS-HCC)    Hyperlipidemia    Pituitary adenoma (CMS/HHS-HCC)    Thyroid  disease    Past Surgical History:  Procedure Laterality Date   MICROSURGERY N/A 10/22/2019   Procedure: MICROSURGERY;  Surgeon: Komisarow, Jordan Michael, MD;  Location: [facility];  Service: Neurosurgery;  Laterality: N/A;   INTRAOPERATIVE FLUOROSCOPY N/A 10/22/2019   Procedure: INTRAOPERATIVE FLOUROSCOPY;  Surgeon: Komisarow, Jordan Michael, MD;  Location: [facility];  Service: Neurosurgery;  Laterality: N/A;   REPAIR DURA SECONDARY FOR CSF LEAK POST SURGERY W/FREE TISSUE GRAFT N/A 10/22/2019   Procedure: REPAIR DURA SECONDARY FOR CSF LEAK POST SURGERY W/FREE TISSUE GRAFT;  Surgeon: Danita Pauletta Shown, MD;  Location: [facility];  Service: Otolaryngology Head and Neck;  Laterality: N/A;   TRANSFER ADJACENT TISSUE NOSE N/A 10/22/2019   Procedure: TRANSFER ADJACENT TISSUE NOSE;  Surgeon: Danita Pauletta Shown, MD;  Location: [facility];  Service: Otolaryngology Head and Neck;  Laterality: N/A;   NASAL ENDOSCOPY N/A 10/22/2019   Procedure: NASAL/SINUS ENDOSCOPY,REMV PART ETHMOID;  Surgeon: Danita Pauletta Shown, MD;  Location: [facility];  Service: Otolaryngology Head and Neck;  Laterality: N/A;   NASAL ENDOSCOPY N/A 10/22/2019   Procedure: NASAL/SINUS ENDOSCOPY,REMV TOTL ETHMOID;  Surgeon: Danita Pauletta Shown, MD;  Location: [facility];  Service: Otolaryngology Head and Neck;  Laterality: N/A;   NASAL ENDOSCOPY N/A 10/22/2019   Procedure: NASAL/SINUS ENDOSCOPY,EXPLOR FRONTAL SINUS;  Surgeon: Danita Pauletta Shown, MD;  Location: [facility];  Service: Otolaryngology Head and Neck;  Laterality: N/A;   Family History  Problem Relation Name Age of Onset   Lung cancer Mother     Prostate cancer Father     High blood pressure (Hypertension) Son Pharmacologist    Brain cancer Maternal  Grandmother     Colon cancer Maternal Grandfather     Heart disease Paternal Grandmother     Social History   Socioeconomic History   Marital status: Divorced  Tobacco Use   Smoking status: Former    Current packs/day: 0.00    Average packs/day: 0.5 packs/day for 15.0 years (7.5 ttl pk-yrs)    Types: Cigarettes    Start date: 06/20/1980    Quit date: 06/21/1995    Years since quitting: 28.5    Passive exposure: Past   Smokeless tobacco: Never  Vaping Use   Vaping  status: Never Used  Substance and Sexual Activity   Alcohol use: Not Currently   Drug use: Never   Sexual activity: Not Currently  Social History Narrative   Lives in [place], [place]. 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    Social Drivers of Corporate Investment Banker Strain: Low Risk  (12/29/2023)   Overall Financial Resource Strain (CARDIA)    Difficulty of Paying Living Expenses: Not very hard  Food Insecurity: No Food Insecurity (12/29/2023)   Hunger Vital Sign    Worried About Running Out of Food in the Last Year: Never true    Ran Out of Food in the Last Year: Never true  Transportation Needs: No Transportation Needs (12/29/2023)   PRAPARE - Administrator, Civil Service (Medical): No    Lack of Transportation (Non-Medical): No  Physical Activity: Unknown (12/11/2020)   Exercise Vital Sign    Days of Exercise per Week: 2 days  Recent Concern: Physical Activity - Insufficiently Active (12/11/2020)   Exercise Vital Sign    Days of Exercise per Week: 2 days    Minutes of Exercise per Session: 20 min  Stress: Stress Concern Present (12/11/2020)   [employer] of Occupational Health - Occupational Stress Questionnaire    Feeling of Stress : Rather much  Housing Stability: Low Risk  (12/29/2023)   Housing Stability Vital Sign    Unable to Pay for Housing in the Last Year: No    Number of Times Moved in the Last Year: 0    Homeless in the Last Year: No   Allergies   Allergen Reactions   Amoxicillin Itching and Nausea And Vomiting    Reaction: unknown    Penicillin Vomiting   Outpatient Encounter Medications as of 01/01/2024  Medication Sig Dispense Refill   acetaminophen  (TYLENOL ) 325 MG tablet Take 3 tablets (975 mg total) by mouth every 6 (six) hours as needed for Pain 180 tablet 0   benzonatate  (TESSALON ) 200 MG capsule Take 1 capsule (200 mg total) by mouth 3 (three) times daily for 7 days 20 capsule 0   blood glucose diagnostic test strip 1 each (1 strip total) 3 (three) times daily Use as instructed. 90 each 12   blood glucose meter kit E11.65 1 each 0   butalbital-acetaminophen -caffeine (FIORICET) 50-325-40 mg tablet Take 1 tablet by mouth every 6 (six) hours as needed     calcium carbonate-vitamin D3 (OS-CAL 500+D) 500 mg-10 mcg (400 unit) tablet Take 1 tablet by mouth 2 (two) times daily with meals     cholecalciferol (VITAMIN D3) 1000 unit tablet Take 1,000 Units by mouth once daily     desmopressin (DDAVP) 0.1 MG tablet 0.3 mg am , 0.1 mg at  6 pm as needed and 0.7 mg in evening 990 tablet 4   hydrocortisone (CORTEF) 10 MG tablet Prn dose for when sick, 20 mg am, 10 mg lunch and 10 mg evening when sick. May take up to 1 week when sick or ill, after finish sick day dose go back to normal dose (cortef 5 mg pill 10 am, 10 lunch, 5 evening) (Patient taking differently: Prn dose for when sick, 10 mg am, 5 mg lunch and 5 mg evening when sick. May take up to 1 week when sick or ill, after finish sick day dose go back to normal dose (cortef 5 mg pill 10 am, 10 lunch, 5 evening)) 35 tablet 11   hydrocortisone (CORTEF) 5 MG tablet 10 mg am,  2.5 mg mid-day, and 5 mg afternoon/early evening 360 tablet 4   hydrocortisone, PF, (SOLU-CORTEF ACT-O-VIAL, PF,) 50 mg/mL injection Inject 2 mLs (100 mg total) into the muscle as needed for adrenal crisis or insufficiency 2 mL 5   lancets Use 1 each 2 (two) times daily Use as instructed. 200 each 1    lancing device with lancets kit Use 1 each 2 (two) times daily Use as instructed. 60 each 12   levothyroxine (SYNTHROID) 50 MCG tablet two pills daily Sun, Mon, AND Wed and one pill the other four days 123 tablet 4   loratadine (CLARITIN) 10 mg tablet Take 1 tablet (10 mg total) by mouth 2 (two) times daily for 10 days 20 tablet 0   ondansetron  (ZOFRAN -ODT) 4 MG disintegrating tablet Take 4 mg by mouth every 6 (six) hours as needed     VITAMIN D3 ORAL Take 1 tablet by mouth once daily     blood glucose diagnostic test strip 1 each (1 strip total) 2 (two) times daily Use as instructed. 60 each 3   No facility-administered encounter medications on file as of 01/01/2024.     ----------------------------------------------------------------------------------------------------------------------  Please see top of note for assessment and plan   Please see patient instructions for contact information      Delon ALONSO Butts, M.D.  Assistant Professor of Medicine  Division of Endocrinology, Metabolism and Nutrition  Department of Medicine  ----------------------------------------------------------------------------------------------------------------------  Patient Instructions  Thank you for choosing [facility] for your health care. Because we care about our patients, we want to give you some helpful information.   Note: If you are experiencing a medical emergency or need urgent medical care, call 911 immediately.  Endocrinology Appointment Center  [phone], option 2 Monday through Friday 7:30 a.m. - 4:30 p.m.  To schedule, reschedule, or cancel your Endocrinology appointment, please call [phone], option 2. If you are unable to come to an appointment, please notify us  as soon as possible, preferably 24 hours in advance. Doing so may allow other patients with urgent needs to be scheduled in a cancelled appointment slot.   Endocrinology Nurse Triage [phone],  option 2   Monday through Friday 8:00 a.m. - 4:00 p.m. If you require medical advice or have any endocrine-related symptoms and need to speak to your providers healthcare team, please call the Endocrinology Nurse Triage [phone], option 2.  For urgent medical concerns after hours or on holidays, call [phone] and ask to speak to the endocrinologist on call.  For non-urgent questions, please send the endocrine healthcare team a message through MyChart at [email or web address].   MyChart messages and voicemail are not checked nights, weekends and holidays. For MyChart messages please allow up to 3 business days to process and for voicemail please allow up to 24 business hours   Prescription Refill Requests Contact your pharmacy  To request prescription refills, please contact your pharmacy or send your healthcare team a Rx Renewal Request through your MyChart account at [email or web address] by using the Request RX Renewal Function.  Supply Forms and Prior Authorizations Call [phone], option 2  Please allow 7-10 business days to process forms or to complete prior authorizations.  For questions, please call [phone], option 2  then option 3 (to speak to a nurse) and option 2 (clinic 1A) location (and choose the and select the for assistance).   Test Results  Test results, which may take up to 14 days, will be communicated to you through your  MyChart account at [email or web address]. If you do not have MyChart, you will receive a letter by mail which may extend this notification time.   Insulin Pumps and Glucose Meters  If you have an insulin pump or glucose meter, please download them prior to your visit and bring them with you to your appointment.

## 2024-01-13 NOTE — Progress Notes (Signed)
 Called pt back  The patient, with secondary adrenal insufficiency, called with recent EMS evaluation for headaches, sweating, and balance issues.  Headache and malaise - Onset last night - Associated with malaise and sweating  Autonomic symptoms - Sweating improved after use of hydrocortisone emergency kit with assistance  Balance disturbance - Persistent balance issues despite previous consultation -Pt reports seeing pcp for HA and balance issues a few weeks ago  Electrolyte status - Recent sodium level within normal limits  Assessment and Plan Adrenal insufficiency Recent symptoms like sweating and not feeling bad likely due to adrenal insufficiency, improved with hydrocortisone. Regimen adjustment required. - Continue double dose hydrocortisone for three days. - Then continue one and a half doses of hydrocortisone for three days. - Return to normal dose of hydrocortisone, adjusted to 15 mg, 5 mg, and 5 mg.  Headache and balance problems Persistent headaches and balance issues. Normal sodium levels recently.  Recommend if pt has symptoms worse than baseline from recent pcp evaluation or persistent symptoms to get  clinical evaluation with pcp, urgent care or ER. I'm out of the office this week so unable to see the patient in clinic.  - Ordered repeat sodium level test. - Advised clinical exam and evaluation at local urgent care, ER, or PCP's office if symptoms persist. (Headaches, dizziness or not feeling well) -pt reports feeling better with mild headaches and still with chronic balance issues -asked pt to notify pcp. I don't think endocrine related given pt has had for a while now and even on double dose steroids, recent sodium and and stable FT4.  -we can repeat sodium  Will cc pt's pcp    Orders Placed This Encounter  Procedures   Basic Metabolic Panel (BMP)    Standing Status:   Future    Expected Date:   01/13/2024    Expiration Date:   05/12/2024    Release to  patient:   Immediate    Requested Prescriptions   Signed Prescriptions Disp Refills   hydrocortisone (CORTEF) 5 MG tablet 450 tablet 4    Sig: 10 mg am, 10 mg mid-day, and 5 mg afternoon/early evening   hydrocortisone (CORTEF) 10 MG tablet 35 tablet 11    Sig: Prn dose for when sick, 20 mg am, 20 mg lunch and 10 mg evening when sick. May take up to 1 week when sick or ill, after finish sick day dose go back to normal dose (cortef 5 mg pill 10 am, 10 lunch, 5 evening)   SOLU-CORTEF ACT-O-VIAL, PF, 100 mg/2 mL injection 2 mL 11    Sig: Inject 2 mLs (100 mg total) into the muscle as directed For adrenal crisis   syringe with needle, safety 3 mL 25 gauge x 1 Syrg 1 each 12    Sig: Use 1 each as directed (for steroid administration)

## 2024-01-30 ENCOUNTER — Emergency Department (HOSPITAL_BASED_OUTPATIENT_CLINIC_OR_DEPARTMENT_OTHER)

## 2024-01-30 ENCOUNTER — Other Ambulatory Visit: Payer: Self-pay

## 2024-01-30 ENCOUNTER — Emergency Department (HOSPITAL_BASED_OUTPATIENT_CLINIC_OR_DEPARTMENT_OTHER)
Admission: EM | Admit: 2024-01-30 | Discharge: 2024-01-30 | Disposition: A | Discharge status: Home / Self Care | Source: Ambulatory Visit | Attending: Emergency Medicine | Admitting: Emergency Medicine

## 2024-01-30 DIAGNOSIS — R519 Headache, unspecified: Secondary | ICD-10-CM | POA: Insufficient documentation

## 2024-01-30 DIAGNOSIS — R200 Anesthesia of skin: Secondary | ICD-10-CM | POA: Insufficient documentation

## 2024-01-30 DIAGNOSIS — Z79899 Other long term (current) drug therapy: Secondary | ICD-10-CM | POA: Insufficient documentation

## 2024-01-30 LAB — CBC WITH DIFFERENTIAL/PLATELET
Abs Immature Granulocytes: 0.02 K/uL (ref 0.00–0.07)
Basophils Absolute: 0 K/uL (ref 0.0–0.1)
Basophils Relative: 1 %
Eosinophils Absolute: 0 K/uL (ref 0.0–0.5)
Eosinophils Relative: 0 %
HCT: 40.2 % (ref 36.0–46.0)
Hemoglobin: 13.7 g/dL (ref 12.0–15.0)
Immature Granulocytes: 0 %
Lymphocytes Relative: 36 %
Lymphs Abs: 2.3 K/uL (ref 0.7–4.0)
MCH: 30.2 pg (ref 26.0–34.0)
MCHC: 34.1 g/dL (ref 30.0–36.0)
MCV: 88.5 fL (ref 80.0–100.0)
Monocytes Absolute: 0.5 K/uL (ref 0.1–1.0)
Monocytes Relative: 8 %
Neutro Abs: 3.5 K/uL (ref 1.7–7.7)
Neutrophils Relative %: 55 %
Platelets: 208 K/uL (ref 150–400)
RBC: 4.54 MIL/uL (ref 3.87–5.11)
RDW: 14.5 % (ref 11.5–15.5)
WBC: 6.5 K/uL (ref 4.0–10.5)
nRBC: 0 % (ref 0.0–0.2)

## 2024-01-30 LAB — BASIC METABOLIC PANEL WITH GFR
Anion gap: 12 (ref 5–15)
BUN: 15 mg/dL (ref 6–20)
CO2: 24 mmol/L (ref 22–32)
Calcium: 9.2 mg/dL (ref 8.9–10.3)
Chloride: 97 mmol/L — ABNORMAL LOW (ref 98–111)
Creatinine, Ser: 0.79 mg/dL (ref 0.44–1.00)
GFR, Estimated: >60 mL/min (ref >60)
Glucose, Bld: 114 mg/dL — ABNORMAL HIGH (ref 70–99)
Potassium: 4.2 mmol/L (ref 3.5–5.1)
Sodium: 132 mmol/L — ABNORMAL LOW (ref 135–145)

## 2024-01-30 MED ORDER — LORAZEPAM 2 MG/ML IJ SOLN: 1.0000 mg | Freq: Once | INTRAMUSCULAR | Status: AC

## 2024-01-30 MED ORDER — GADOBUTROL 1 MMOL/ML IV SOLN
9.0000 mL | Freq: Once | INTRAVENOUS | Status: AC | PRN
  Administered 2024-01-30: 9 mL via INTRAVENOUS

## 2024-01-30 MED ORDER — HYDROCORTISONE SOD SUC (PF) 100 MG IJ SOLR
100.0000 mg | Freq: Once | INTRAMUSCULAR | Status: AC
  Administered 2024-01-30: 100 mg via INTRAVENOUS
  Filled 2024-01-30: qty 2

## 2024-01-30 MED ORDER — PROCHLORPERAZINE EDISYLATE 10 MG/2ML IJ SOLN
5.0000 mg | Freq: Once | INTRAMUSCULAR | Status: AC
  Administered 2024-01-30: 5 mg via INTRAVENOUS
  Filled 2024-01-30: qty 2

## 2024-01-30 MED ORDER — MAGNESIUM SULFATE 2 GM/50ML IV SOLN
2.0000 g | Freq: Once | INTRAVENOUS | Status: AC
  Administered 2024-01-30: 2 g via INTRAVENOUS
  Filled 2024-01-30: qty 50

## 2024-01-30 MED ORDER — DIPHENHYDRAMINE HCL 50 MG/ML IJ SOLN
12.5000 mg | Freq: Once | INTRAMUSCULAR | Status: AC
  Administered 2024-01-30: 12.5 mg via INTRAVENOUS
  Filled 2024-01-30: qty 1

## 2024-01-30 MED ORDER — LORAZEPAM 2 MG/ML IJ SOLN
INTRAMUSCULAR | Status: AC
  Administered 2024-01-30: 1 mg via INTRAVENOUS
  Filled 2024-01-30: qty 1

## 2024-01-30 NOTE — Discharge Instructions (Signed)
 Follow-up with your neurologist and primary care doctor.  Return if symptoms worsen.

## 2024-01-30 NOTE — ED Notes (Signed)
 EDP at bedside reviewing test results, going over discharge plan.

## 2024-01-30 NOTE — ED Triage Notes (Signed)
 Patient reports numbness in left arm for past week. Also reports tingling in left leg. Ambulatory to triage. Saw PCP today who told her to come to ER. Van Negative in triage.

## 2024-01-30 NOTE — ED Notes (Addendum)
 This RN advised that pt was extremely claustrophobic while on MRI table; VRBO for ativan given, med pulled & administered

## 2024-01-30 NOTE — ED Notes (Signed)
 MRI available, pt to MRI via wheelchair at this time.

## 2024-01-30 NOTE — ED Provider Notes (Addendum)
 [place] EMERGENCY DEPARTMENT AT [facility] Provider Note   CSN: [id number] Arrival date & time: 01/30/24  1445     Patient presents with: Numbness   Rachel Bowen is a 58 y.o. female.   Patient here with headache left arm and leg numbness but mostly in the left upper arm.  She has history of pituitary adenoma and adrenal insufficiency and diabetes insipidus.  She is on chronic Solu-Cortef.  She has had some fatigue recently numbness for the last week or so.  She denies any vision loss speech changes coordination issues.  She has had a mild headache on and off.  She denies any fever chills cough sputum production pain urination.  She has surgery to resect pituitary adenoma couple years ago.  The history is provided by the patient.       Prior to Admission medications  Medication Sig Start Date End Date Taking? Authorizing Provider  azithromycin  (ZITHROMAX ) 250 MG tablet Take by mouth. 07/31/23   [provider]  butalbital-acetaminophen -caffeine (FIORICET) 50-325-40 MG tablet Take 1 tablet by mouth every 6 (six) hours as needed. 07/31/23   [provider]  Calcium Carb-Cholecalciferol 500-10 MG-MCG TABS Take 1 tablet by mouth.    [provider]  cholecalciferol (VITAMIN D3) 25 MCG (1000 UNIT) tablet Take 1,000 Units by mouth daily.    [provider]  cyclobenzaprine  (FLEXERIL ) 10 MG tablet Take 1 tablet (10 mg total) by mouth 2 (two) times daily as needed for muscle spasms. 11/13/21   Billy Asberry FALCON, PA-C  desmopressin (DDAVP) 0.1 MG tablet Take by mouth. 07/02/21   [provider]  dexamethasone  (DECADRON ) 2 MG tablet Take 1 tablet (2 mg total) by mouth daily. 05/31/19   Vaslow, Zachary K, MD  dexamethasone  (DECADRON ) 4 MG tablet dexamethasone  4 mg tablet  TAKE 1 TABLET (4 MG TOTAL) BY MOUTH DAILY.    [provider]  escitalopram (LEXAPRO) 10 MG tablet Take 10 mg by mouth daily. 06/11/21   [provider]   ferrous sulfate 325 (65 FE) MG tablet Take 325 mg by mouth daily. 04/20/19 05/20/19  [provider]  Fingerstix Lancets MISC 1 each by Other route. 05/08/23 05/07/24  [provider]  glucose blood (PRECISION QID TEST) test strip 1 strip. 04/04/23 04/03/24  [provider]  hydrocortisone (CORTEF) 10 MG tablet Hydrocortisone 10 mg am and half pill (5 mg) at lunch and 5 mg 4 pm 05/01/21   [provider]  hydrocortisone sodium succinate (SOLU-CORTEF) 100 MG injection Inject 100 mg into the muscle. 11/02/21   [provider]  hydrOXYzine  (ATARAX ) 25 MG tablet Take 1 tablet (25 mg total) by mouth every 6 (six) hours as needed. Prn itching 08/30/23   Ward, Harlene PEDLAR, PA-C  levothyroxine (SYNTHROID) 50 MCG tablet Take by mouth. 06/11/21   [provider]  LORazepam (ATIVAN) 0.5 MG tablet Take one tablet by mouth one hour prior to MRI. May repeat after 30 minutes if needed for anxiety 05/14/21   [provider]  metoprolol  tartrate (LOPRESSOR ) 100 MG tablet Take tablet TWO hours prior to your cardiac CT scan. 06/19/22   O'Neal, Darryle Ned, MD  Multiple Vitamins-Minerals (EMERGEN-C VITAMIN C) CHEW Chew 1 tablet by mouth daily. 04/18/19   [provider]  ondansetron  (ZOFRAN -ODT) 4 MG disintegrating tablet Take 1 tablet (4 mg total) by mouth every 6 (six) hours as needed for nausea or vomiting. 06/25/23   Beatty, Celeste A, PA-C  ONETOUCH ULTRA TEST test  strip SMARTSIG:1 Each Twice Daily    [provider]  pantoprazole (PROTONIX) 40 MG tablet Take 40 mg by mouth in the morning and at bedtime. x14 days 04/18/19 05/02/19  [provider]    Allergies: Amoxicillin and Penicillin g    Review of Systems  Updated Vital Signs  ED Triage Vitals  Encounter Vitals Group     BP 01/30/24 1449 (!) 154/81     Girls Systolic BP Percentile --      Girls Diastolic BP Percentile --      Boys Systolic BP Percentile --      Boys Diastolic BP  Percentile --      Pulse Rate 01/30/24 1449 63     Resp 01/30/24 1449 17     Temp 01/30/24 1449 98.2 F (36.8 C)     Temp Source 01/30/24 1449 Oral     SpO2 01/30/24 1449 98 %     Weight --      Height --      Head Circumference --      Peak Flow --      Pain Score 01/30/24 1449 4     Pain Loc --      Pain Education --      Exclude from Growth Chart --     Physical Exam Vitals and nursing note reviewed.  Constitutional:      General: She is not in acute distress.    Appearance: She is well-developed.  HENT:     Head: Normocephalic and atraumatic.     Nose: Nose normal.     Mouth/Throat:     Mouth: Mucous membranes are moist.  Eyes:     Extraocular Movements: Extraocular movements intact.     Conjunctiva/sclera: Conjunctivae normal.     Pupils: Pupils are equal, round, and reactive to light.  Cardiovascular:     Rate and Rhythm: Normal rate and regular rhythm.     Pulses: Normal pulses.     Heart sounds: Normal heart sounds. No murmur heard. Pulmonary:     Effort: Pulmonary effort is normal. No respiratory distress.     Breath sounds: Normal breath sounds.  Abdominal:     Palpations: Abdomen is soft.     Tenderness: There is no abdominal tenderness.  Musculoskeletal:        General: No swelling.     Cervical back: Neck supple.  Skin:    General: Skin is warm and dry.     Capillary Refill: Capillary refill takes less than 2 seconds.  Neurological:     General: No focal deficit present.     Mental Status: She is alert and oriented to person, place, and time.     Cranial Nerves: No cranial nerve deficit.     Sensory: Sensory deficit (subjective numbness to left humeral area but sensation intact otherwise) present.     Motor: No weakness.     Comments: Normal strength, normal speech, normal coordination, she has some subjective sensory loss in her left humeral area but otherwise sensations intact throughout normal visual fields normal speech   Psychiatric:        Mood  and Affect: Mood normal.     (all labs ordered are listed, but only abnormal results are displayed) Labs Reviewed  BASIC METABOLIC PANEL WITH GFR - Abnormal; Notable for the following components:      Result Value   Sodium 132 (*)    Chloride 97 (*)    Glucose, Bld 114 (*)  All other components within normal limits  CBC WITH DIFFERENTIAL/PLATELET    EKG: None  Radiology: MR Brain W and Wo Contrast Result Date: 01/30/2024 EXAM: MRI BRAIN WITH AND WITHOUT CONTRAST 01/30/2024 07:53:28 PM TECHNIQUE: Multiplanar multisequence MRI of the head/brain was performed with and without the administration of intravenous contrast. 9 mL (gadobutrol  (GADAVIST ) 1 MMOL/ML injection 9 mL GADOBUTROL  1 MMOL/ML IV SOLN). COMPARISON: Head CT 01/30/2024 and MRI 05/13/2019. CLINICAL HISTORY: Neuro deficit, acute, stroke suspected. Headache. Left arm and leg numbness. FINDINGS: BRAIN AND VENTRICLES: There is no evidence of an acute infarct, midline shift, hydrocephalus, or extra-axial fluid collection. Cerebral volume is normal. Scattered small T2 hyperintensities in the cerebral white matter bilaterally are mildly advanced for age and have mildly progressed from 2021. Sequelae of transsphenoidal pituitary/suprasellar tumor resection are identified, new from the 2021 MRI although serial postoperative imaging has been performed at [facility]. Dedicated pituitary imaging was not performed today, although no gross enhancing mass is identified. The infundibulum and optic chiasm appear mildly thickened and distorted/tethered. ORBITS: No acute abnormality. SINUSES: No acute abnormality. BONES AND SOFT TISSUES: Normal bone marrow signal and enhancement. No acute soft tissue abnormality. IMPRESSION: 1. No acute intracranial abnormality. 2. Progressive, mild chronic cerebral white matter disease since 2021, nonspecific but most commonly related to small vessel ischemic changes related to chronic small vessel ischemia. 3. Postoperative  changes from sellar/suprasellar tumor resection, not evaluated in detail on this routine brain MRI. Electronically signed by: Dasie Hamburg MD 01/30/2024 08:23 PM EST RP Workstation: [id number]   MR Cervical Spine W and Wo Contrast Result Date: 01/30/2024 EXAM: MRI CERVICAL SPINE WITHOUT AND WITH CONTRAST 01/30/2024 07:52:02 PM TECHNIQUE: Multiplanar multisequence MRI of the cervical spine was performed without and with intravenous contrast. 9 mL of gadobutrol  (GADAVIST ) 1 MMOL/ML injection was administered intravenously. COMPARISON: None available. CLINICAL HISTORY: Myelopathy, acute, cervical spine. Headache. Left arm and leg numbness. FINDINGS: LIMITATIONS: The examination is mildly motion degraded. BONES AND ALIGNMENT: Mild reversal of the normal cervical lordosis. No significant listhesis. No fracture, suspicious marrow lesion, or significant marrow edema. SPINAL CORD: No abnormal spinal cord signal. SOFT TISSUES: No paraspinal mass. DISC LEVELS: C2-C3: Asymmetric right facet arthrosis with facet ankylosis. No disc herniation or stenosis. C3-C4: Mild disc bulging without stenosis. C4-C5: Moderate disc space narrowing. Disc bulging and uncovertebral spurring result in mild spinal stenosis and mild left neural foraminal stenosis. C5-C6: Moderate disc space narrowing. Disc bulging and uncovertebral spurring result in mild to moderate spinal stenosis with mild cord flattening and mild bilateral neural foraminal stenosis. C6-C7: Disc bulging and uncovertebral spurring result in mild spinal stenosis and mild left neural foraminal stenosis. C7-T1: Negative. IMPRESSION: 1. Multilevel cervical disc degeneration, most notable at C5-C6 where there is mild to moderate spinal stenosis. 2. Mild spinal stenosis at C4-C5 and C6-C7. 3. Normal spinal cord signal. Electronically signed by: Dasie Hamburg MD 01/30/2024 08:07 PM EST RP Workstation: [id number]   CT Head Wo Contrast Result Date: 01/30/2024 CLINICAL DATA:  Headache  with increasing frequency and severity. EXAM: CT HEAD WITHOUT CONTRAST TECHNIQUE: Contiguous axial images were obtained from the base of the skull through the vertex without intravenous contrast. RADIATION DOSE REDUCTION: This exam was performed according to the departmental dose-optimization program which includes automated exposure control, adjustment of the mA and/or kV according to patient size and/or use of iterative reconstruction technique. COMPARISON:  06/12/2023 FINDINGS: Brain: No evidence of intracranial hemorrhage, acute infarction, hydrocephalus, extra-axial collection, or mass lesion/mass effect. Vascular:  No hyperdense vessel  or other acute findings. Skull: No evidence of fracture or other significant bone abnormality. Sinuses/Orbits: Chronic mucosal thickening of the sphenoid sinus. Previous sphenoid and ethmoid sinus surgery. Other: None. IMPRESSION: No intracranial abnormality. Chronic sphenoid sinus disease. Electronically Signed   By: Norleen DELENA Kil M.D.   On: 01/30/2024 18:37     Procedures   Medications Ordered in the ED  prochlorperazine (COMPAZINE) injection 5 mg (5 mg Intravenous Given 01/30/24 1517)  diphenhydrAMINE  (BENADRYL ) injection 12.5 mg (12.5 mg Intravenous Given 01/30/24 1517)  magnesium sulfate IVPB 2 g 50 mL (0 g Intravenous Stopped 01/30/24 1746)  hydrocortisone sodium succinate (SOLU-CORTEF) 100 MG injection 100 mg (100 mg Intravenous Given 01/30/24 1729)  LORazepam (ATIVAN) injection 1 mg (1 mg Intravenous Given 01/30/24 1849)  gadobutrol  (GADAVIST ) 1 MMOL/ML injection 9 mL (9 mLs Intravenous Contrast Given 01/30/24 1946)                                    Medical Decision Making Amount and/or Complexity of Data Reviewed Labs: ordered. Radiology: ordered.  Risk Prescription drug management.   Rachel Bowen is here with left leg and arm numbness.  However on exam she is really has some maybe sensory changes in her left humerus but otherwise normal  sensation throughout.  She does have history of pituitary adenoma status post resection, adrenal insufficiency, diabetes insipidus.  She has not felt good the last week or so.  Numbness and tingling been going on for the last week as well.  Overall her neurological exam is normal despite some sensory changes to the left upper arm.  She got normal strength speech coordination visual fields.  She has been compliant with her Solu-Cortef.  She has normal vitals.  Blood pressure is reassuring.  Ultimately will get an MRI of the brain and cervical spine to evaluate for any intracranial process or cervical process.  She does have a mild migraine and could be a complex migraine.  Will check her electrolytes and reevaluate.  Have no concern for meningitis or any other acute process.  This could be part of her adrenal insufficiency as well.  Overall lab work is unremarkable per my review interpretation.  Headache cocktail with improvement.  She usually takes oral Solu-Cortef and nighttime will give her a dose of IV Solu-Cortef and magnesium for further headache relief.    MRIs are unremarkable.  She is feeling much better.  Cannot really explain her symptoms today may be a peripheral nerve process or maybe a paresthesia process.  She will follow-up with neurology and her primary care.  Told to return if symptoms worsen but there is no evidence of stroke or any other acute process.  Discharged in good condition.  This chart was dictated using voice recognition software.  Despite best efforts to proofread,  errors can occur which can change the documentation meaning.      Final diagnoses:  Numbness    ED Discharge Orders     None          Ruthe Cornet, DO 01/30/24 1739    Ruthe Cornet, DO 01/30/24 2042

## 2024-02-06 ENCOUNTER — Other Ambulatory Visit: Payer: Self-pay | Admitting: Family Medicine

## 2024-02-06 DIAGNOSIS — E1165 Type 2 diabetes mellitus with hyperglycemia: Secondary | ICD-10-CM

## 2024-02-06 DIAGNOSIS — R9082 White matter disease, unspecified: Secondary | ICD-10-CM

## 2024-02-06 DIAGNOSIS — E230 Hypopituitarism: Secondary | ICD-10-CM

## 2024-06-02 ENCOUNTER — Other Ambulatory Visit (HOSPITAL_BASED_OUTPATIENT_CLINIC_OR_DEPARTMENT_OTHER)
